# Patient Record
Sex: Female | Born: 2000 | Race: White | Hispanic: No | Marital: Single | Smoking: Former smoker
Health system: Southern US, Community
[De-identification: ages and names within clinical notes are randomized; demographics above are authoritative.]

## PROBLEM LIST (undated history)

## (undated) DIAGNOSIS — T7840XA Allergy, unspecified, initial encounter: Secondary | ICD-10-CM

## (undated) DIAGNOSIS — G43909 Migraine, unspecified, not intractable, without status migrainosus: Secondary | ICD-10-CM

## (undated) DIAGNOSIS — J45909 Unspecified asthma, uncomplicated: Secondary | ICD-10-CM

## (undated) DIAGNOSIS — F419 Anxiety disorder, unspecified: Secondary | ICD-10-CM

## (undated) DIAGNOSIS — A770 Spotted fever due to Rickettsia rickettsii: Secondary | ICD-10-CM

---
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
## 2020-10-09 ENCOUNTER — Other Ambulatory Visit: Payer: Self-pay | Admitting: Internal Medicine

## 2020-10-10 ENCOUNTER — Other Ambulatory Visit: Payer: Self-pay | Admitting: Internal Medicine

## 2020-10-10 DIAGNOSIS — R1011 Right upper quadrant pain: Secondary | ICD-10-CM

## 2020-10-27 ENCOUNTER — Other Ambulatory Visit

## 2020-11-24 ENCOUNTER — Ambulatory Visit
Admission: RE | Admit: 2020-11-24 | Discharge: 2020-11-24 | Disposition: A | Discharge status: Home / Self Care | Source: Ambulatory Visit | Attending: Internal Medicine | Admitting: Internal Medicine

## 2020-11-24 DIAGNOSIS — R1011 Right upper quadrant pain: Secondary | ICD-10-CM

## 2021-01-09 ENCOUNTER — Emergency Department (HOSPITAL_COMMUNITY)

## 2021-01-09 ENCOUNTER — Other Ambulatory Visit: Payer: Self-pay

## 2021-01-09 ENCOUNTER — Encounter (HOSPITAL_COMMUNITY): Payer: Self-pay

## 2021-01-09 ENCOUNTER — Emergency Department (HOSPITAL_COMMUNITY)
Admission: EM | Admit: 2021-01-09 | Discharge: 2021-01-09 | Disposition: A | Discharge status: Home / Self Care | Attending: Emergency Medicine | Admitting: Emergency Medicine

## 2021-01-09 DIAGNOSIS — U071 COVID-19: Secondary | ICD-10-CM | POA: Diagnosis not present

## 2021-01-09 DIAGNOSIS — R42 Dizziness and giddiness: Secondary | ICD-10-CM | POA: Diagnosis not present

## 2021-01-09 DIAGNOSIS — R051 Acute cough: Secondary | ICD-10-CM | POA: Diagnosis not present

## 2021-01-09 DIAGNOSIS — R918 Other nonspecific abnormal finding of lung field: Secondary | ICD-10-CM | POA: Diagnosis not present

## 2021-01-09 DIAGNOSIS — Z87891 Personal history of nicotine dependence: Secondary | ICD-10-CM | POA: Diagnosis not present

## 2021-01-09 DIAGNOSIS — M791 Myalgia, unspecified site: Secondary | ICD-10-CM

## 2021-01-09 DIAGNOSIS — R071 Chest pain on breathing: Secondary | ICD-10-CM | POA: Insufficient documentation

## 2021-01-09 LAB — CBC WITH DIFFERENTIAL/PLATELET
Abs Immature Granulocytes: 0.05 10*3/uL (ref 0.00–0.07)
Basophils Absolute: 0 10*3/uL (ref 0.0–0.1)
Basophils Relative: 0 %
Eosinophils Absolute: 0.1 10*3/uL (ref 0.0–0.5)
Eosinophils Relative: 1 %
HCT: 38.9 % (ref 36.0–46.0)
Hemoglobin: 13.1 g/dL (ref 12.0–15.0)
Immature Granulocytes: 1 %
Lymphocytes Relative: 6 %
Lymphs Abs: 0.4 10*3/uL — ABNORMAL LOW (ref 0.7–4.0)
MCH: 28.9 pg (ref 26.0–34.0)
MCHC: 33.7 g/dL (ref 30.0–36.0)
MCV: 85.7 fL (ref 80.0–100.0)
Monocytes Absolute: 0.9 10*3/uL (ref 0.1–1.0)
Monocytes Relative: 12 %
Neutro Abs: 5.9 10*3/uL (ref 1.7–7.7)
Neutrophils Relative %: 80 %
Platelets: 180 10*3/uL (ref 150–400)
RBC: 4.54 MIL/uL (ref 3.87–5.11)
RDW: 12.2 % (ref 11.5–15.5)
WBC: 7.3 10*3/uL (ref 4.0–10.5)
nRBC: 0 % (ref 0.0–0.2)

## 2021-01-09 LAB — COMPREHENSIVE METABOLIC PANEL
ALT: 22 U/L (ref 0–44)
AST: 20 U/L (ref 15–41)
Albumin: 4 g/dL (ref 3.5–5.0)
Alkaline Phosphatase: 51 U/L (ref 38–126)
Anion gap: 6 (ref 5–15)
BUN: 7 mg/dL (ref 6–20)
CO2: 24 mmol/L (ref 22–32)
Calcium: 9.1 mg/dL (ref 8.9–10.3)
Chloride: 106 mmol/L (ref 98–111)
Creatinine, Ser: 0.63 mg/dL (ref 0.44–1.00)
GFR, Estimated: >60 mL/min (ref >60)
Glucose, Bld: 89 mg/dL (ref 70–99)
Potassium: 3.2 mmol/L — ABNORMAL LOW (ref 3.5–5.1)
Sodium: 136 mmol/L (ref 135–145)
Total Bilirubin: 0.3 mg/dL (ref 0.3–1.2)
Total Protein: 6.8 g/dL (ref 6.5–8.1)

## 2021-01-09 LAB — I-STAT BETA HCG BLOOD, ED (MC, WL, AP ONLY): I-stat hCG, quantitative: <5 m[IU]/mL (ref <5)

## 2021-01-09 LAB — TROPONIN I (HIGH SENSITIVITY): Troponin I (High Sensitivity): <2 ng/L (ref <18)

## 2021-01-09 LAB — D-DIMER, QUANTITATIVE: D-Dimer, Quant: 1.54 ug/mL-FEU — ABNORMAL HIGH (ref 0.00–0.50)

## 2021-01-09 MED ORDER — LORAZEPAM 2 MG/ML IJ SOLN
1.0000 mg | Freq: Once | INTRAMUSCULAR | Status: AC
  Administered 2021-01-09: 1 mg via INTRAVENOUS
  Filled 2021-01-09: qty 1

## 2021-01-09 MED ORDER — ACETAMINOPHEN 500 MG PO TABS
1000.0000 mg | ORAL_TABLET | Freq: Once | ORAL | Status: AC
  Administered 2021-01-09: 1000 mg via ORAL
  Filled 2021-01-09: qty 2

## 2021-01-09 MED ORDER — ONDANSETRON HCL 4 MG/2ML IJ SOLN
4.0000 mg | Freq: Once | INTRAMUSCULAR | Status: AC
  Administered 2021-01-09: 4 mg via INTRAVENOUS
  Filled 2021-01-09: qty 2

## 2021-01-09 MED ORDER — IBUPROFEN 600 MG PO TABS: 600.0000 mg | ORAL_TABLET | Freq: Four times a day (QID) | ORAL | 0 refills | Status: AC | PRN

## 2021-01-09 MED ORDER — KETOROLAC TROMETHAMINE 30 MG/ML IJ SOLN
30.0000 mg | Freq: Once | INTRAMUSCULAR | Status: AC
  Administered 2021-01-09: 30 mg via INTRAVENOUS
  Filled 2021-01-09: qty 1

## 2021-01-09 MED ORDER — ONDANSETRON 4 MG PO TBDP: ORAL_TABLET | ORAL | 0 refills | Status: DC

## 2021-01-09 MED ORDER — SODIUM CHLORIDE 0.9 % IV BOLUS
1000.0000 mL | Freq: Once | INTRAVENOUS | Status: AC
  Administered 2021-01-09: 1000 mL via INTRAVENOUS

## 2021-01-09 MED ORDER — IOHEXOL 350 MG/ML SOLN
50.0000 mL | Freq: Once | INTRAVENOUS | Status: AC | PRN
  Administered 2021-01-09: 50 mL via INTRAVENOUS

## 2021-01-09 NOTE — Discharge Instructions (Addendum)
You have tested positive for COVID-19 virus.  Please continue to quarantine at home and monitor your symptoms closely. You chest x-ray was clear and CT shows no evidence of blood clot or pneumonia, the rest of your lab work has been reassuring. Antibiotics are not helpful in treating viral infection, the virus should run its course in about 10-14 days. Please make sure you are drinking plenty of fluids. You can treat your symptoms supportively with tylenol 1000 mg and ibuprofen 600 mg every 6 hours for fevers and pains, Zofran as needed for nausea, and over the counter cough syrups and throat lozenges to help with cough. If your symptoms are not improving please follow up with you Primary doctor.   I recommend that you purchase a home pulse ox to help better monitor your oxygen at home, if you start to have increased work of breathing or shortness of breath or your oxygen drops below 90% please immediately return to the hospital for reevaluation.  If you develop persistent fevers, shortness of breath or difficulty breathing, chest pain, severe headache and neck pain, persistent nausea and vomiting or other new or concerning symptoms return to the Emergency department.

## 2021-01-09 NOTE — ED Provider Notes (Signed)
[facility] EMERGENCY DEPARTMENT Provider Note   CSN: [id number] Arrival date & time: 01/09/21  1232     History Chief Complaint  Patient presents with  . Covid Positive    Deborah Gould is a 20 y.o. female.  Deborah Gould is a 20 y.o. female with a history of anxiety, migraines, [place] spotted fever, who presents to the ED via EMS from [facility] for evaluation of body aches and chest pain after patient was diagnosed with COVID.  Patient reports symptoms all started this morning, fever of 101, diffuse body aches and chest pain.  Patient reports it feels like something is sitting on her chest and pain is worse when she breathes.  She went to [facility] care clinic where she tested positive for COVID.  Patient reports she is received 2 doses of vaccine but no booster.  Reports she is having diffuse body aches that are severe.  Not improved with Tylenol that she took prior to arrival.  Reports that she is felt a bit lightheaded today but has not had anything to eat or drink.  Has had some nausea but no vomiting.  Denies abdominal pain.  Reports some associated cough and congestion.  Prior history of asthma in childhood but has not had any issues with her asthma in many years.  Also reports anxiety.  Because of patient's tachycardia and complaints of chest pain severe body aches, she was sent for further evaluation of typical COVID symptoms versus PE.        Past Medical History:  Diagnosis Date  . Allergy   . Anxiety    Panic Attacks  . Migraine   . [place] spotted fever     Patient Active Problem List   Diagnosis Date Noted  . Numbness 06/15/2019  . Tremor 06/15/2019  . Vitamin D deficiency 06/15/2019  . Neck pain 06/15/2019  . Seizure-like activity (HCC) 10/17/2017  . Intractable migraine without aura and with status migrainosus 05/15/2017  . Chronic daily headache 05/15/2017  . Anxiety state 05/15/2017  . Insomnia 05/15/2017   . Acute sinusitis 05/15/2017  . Appendicitis 08/01/2016  . Acute appendicitis 08/01/2016    Past Surgical History:  Procedure Laterality Date  . LAPAROSCOPIC APPENDECTOMY N/A 08/01/2016   Procedure: APPENDECTOMY LAPAROSCOPIC;  Surgeon: Leonia Corona, MD;  Location: [facility] OR;  Service: General;  Laterality: N/A;  . Nexplanon insertion  05/14/2019  . RADIOLOGY WITH ANESTHESIA N/A 03/16/2019   Procedure: MRI OF CERVICAL SPINE AND LUMBAR SPINE WITHOUT CONTRAST;  Surgeon: Radiologist, Medication, MD;  Location: [facility] OR;  Service: Radiology;  Laterality: N/A;  . RADIOLOGY WITH ANESTHESIA N/A 07/06/2019   Procedure: MRI WITH ANESTHESIA OF BRAIN WITH AND WIHTOUT CONTRAST;  Surgeon: Radiologist, Medication, MD;  Location: [facility] OR;  Service: Radiology;  Laterality: N/A;     OB History    Gravida  0   Para  0   Term  0   Preterm  0   AB  0   Living  0     SAB  0   IAB  0   Ectopic  0   Multiple  0   Live Births  0           Family History  Adopted: Yes  Problem Relation Age of Onset  . Seizures Mother   . Depression Mother   . Anxiety disorder Mother   . Drug abuse Mother   . Ovarian cysts Mother   . Depression  Father   . Anxiety disorder Father   . Drug abuse Father   . Migraines Neg Hx   . Bipolar disorder Neg Hx   . Schizophrenia Neg Hx   . ADD / ADHD Neg Hx   . Autism Neg Hx     Social History   Tobacco Use  . Smoking status: Former Smoker    Types: Cigarettes    Quit date: 09/16/2016    Years since quitting: 4.3  . Smokeless tobacco: Never Used  Vaping Use  . Vaping Use: Every day  . Substances: Nicotine, CBD, Flavoring  . Devices: Mint 50% [employer]  Substance Use Topics  . Alcohol use: No  . Drug use: Not Currently    Types: Marijuana    Comment: Last Use Monday 03/15/19     Home Medications Prior to Admission medications   Medication Sig Start Date End Date Taking? Authorizing Provider  ibuprofen (ADVIL) 600 MG tablet Take 1 tablet (600 mg  total) by mouth every 6 (six) hours as needed. 01/09/21  Yes Dartha Lodge, PA-C  ondansetron (ZOFRAN ODT) 4 MG disintegrating tablet 4mg  ODT q4 hours prn nausea/vomit 01/09/21  Yes Ryanna Teschner, 03/11/21, PA-C  Cholecalciferol (DIALYVITE VITAMIN D 5000) 125 MCG (5000 UT) capsule Take 5,000 Units by mouth daily.    [provider]  lamoTRIgine (LAMICTAL) 25 MG tablet Take 25 mg by mouth daily.  06/09/19   [provider]  LORazepam (ATIVAN) 0.5 MG tablet Take 0.5 mg by mouth at bedtime.  06/09/19   [provider]  Melatonin 10 MG CAPS Take 10 mg by mouth at bedtime.    [provider]  meloxicam (MOBIC) 15 MG tablet Take 15 mg by mouth daily.  06/09/19   [provider]  propranolol ER (INDERAL LA) 60 MG 24 hr capsule Take 1 capsule (60 mg total) by mouth daily. 06/15/19   Sater, 06/17/19, MD  sertraline (ZOLOFT) 25 MG tablet Take 25 mg by mouth daily. 06/09/19   [provider]    Allergies    Other and Mold extract [trichophyton]  Review of Systems   Review of Systems  Constitutional: Positive for chills and fever.  HENT: Positive for congestion.   Respiratory: Positive for cough and shortness of breath.   Cardiovascular: Positive for chest pain. Negative for palpitations and leg swelling.  Gastrointestinal: Positive for nausea. Negative for abdominal pain and vomiting.  Musculoskeletal: Positive for arthralgias and myalgias.  Skin: Negative for color change and rash.  Neurological: Positive for headaches.  All other systems reviewed and are negative.   Physical Exam Updated Vital Signs BP 109/85 (BP Location: Right Arm)   Pulse (!) 110   Temp 99 F (37.2 C) (Oral)   Resp (!) 22   Ht 5\' 3"  (1.6 m)   Wt 70.3 kg   SpO2 99%   BMI 27.46 kg/m   Physical Exam Vitals and nursing note reviewed.  Constitutional:      General: She is not in acute distress.    Appearance: Normal appearance. She is well-developed. She is not ill-appearing  or diaphoretic.     Comments: Patient is alert, she appears very anxious and uncomfortable  HENT:     Head: Normocephalic and atraumatic.     Nose: Congestion and rhinorrhea present.     Mouth/Throat:     Mouth: Mucous membranes are moist.     Pharynx: Oropharynx is clear. No posterior oropharyngeal erythema.  Eyes:  General:        Right eye: No discharge.        Left eye: No discharge.  Neck:     Comments: No rigidity Cardiovascular:     Rate and Rhythm: Regular rhythm. Tachycardia present.     Heart sounds: Normal heart sounds. No murmur heard. No friction rub. No gallop.   Pulmonary:     Effort: Pulmonary effort is normal. No respiratory distress.     Breath sounds: Normal breath sounds.     Comments: Respirations equal and unlabored, patient able to speak in full sentences, lungs clear to auscultation bilaterally  Abdominal:     General: Bowel sounds are normal. There is no distension.     Palpations: Abdomen is soft. There is no mass.     Tenderness: There is no abdominal tenderness. There is no guarding.     Comments: Abdomen soft, nondistended, nontender to palpation in all quadrants without guarding or peritoneal signs  Musculoskeletal:        General: No deformity.     Cervical back: Neck supple.  Lymphadenopathy:     Cervical: No cervical adenopathy.  Skin:    General: Skin is warm and dry.     Capillary Refill: Capillary refill takes less than 2 seconds.  Neurological:     Mental Status: She is alert and oriented to person, place, and time.     Comments: Speech is clear, able to follow commands Moves extremities without ataxia, coordination intact  Psychiatric:        Mood and Affect: Mood normal.        Behavior: Behavior normal.     ED Results / Procedures / Treatments   Labs (all labs ordered are listed, but only abnormal results are displayed) Labs Reviewed  COMPREHENSIVE METABOLIC PANEL - Abnormal; Notable for the following components:      Result  Value   Potassium 3.2 (*)    All other components within normal limits  CBC WITH DIFFERENTIAL/PLATELET - Abnormal; Notable for the following components:   Lymphs Abs 0.4 (*)    All other components within normal limits  D-DIMER, QUANTITATIVE - Abnormal; Notable for the following components:   D-Dimer, Quant 1.54 (*)    All other components within normal limits  I-STAT BETA HCG BLOOD, ED ([facility], [facility], [facility] ONLY)  TROPONIN I (HIGH SENSITIVITY)  TROPONIN I (HIGH SENSITIVITY)    EKG EKG Interpretation  Date/Time:  Tuesday Jan 09 2021 12:40:48 EDT Ventricular Rate:  97 PR Interval:  148 QRS Duration: 80 QT Interval:  322 QTC Calculation: 408 R Axis:   72 Text Interpretation: Normal sinus rhythm Normal ECG Confirmed by Virgina Norfolk ([id number]) on 01/09/2021 12:45:42 PM   Radiology CT Angio Chest PE W and/or Wo Contrast  Result Date: 01/09/2021 CLINICAL DATA:  Chest pain. Concern for pulmonary embolism. COVID positive. EXAM: CT ANGIOGRAPHY CHEST WITH CONTRAST TECHNIQUE: Multidetector CT imaging of the chest was performed using the standard protocol during bolus administration of intravenous contrast. Multiplanar CT image reconstructions and MIPs were obtained to evaluate the vascular anatomy. CONTRAST:  3mL OMNIPAQUE IOHEXOL 350 MG/ML SOLN COMPARISON:  Chest radiograph 01/10/2019 FINDINGS: Cardiovascular: No filling defects within the pulmonary arteries to suggest acute pulmonary embolism. Mediastinum/Nodes: No axillary or supraclavicular adenopathy. No mediastinal or hilar adenopathy. No pericardial fluid. Esophagus normal. Lungs/Pleura: No pulmonary infarction. No pleural fluid or pneumonia. Upper Abdomen: Limited view of the liver, kidneys, pancreas are unremarkable. Normal adrenal glands. Musculoskeletal: No osseous abnormality Review of  the MIP images confirms the above findings. IMPRESSION: 1. No evidence acute pulmonary embolism. 2. No acute pulmonary parenchymal abnormality. Electronically Signed    By: Genevive Bi M.D.   On: 01/09/2021 17:37   DG Chest Port 1 View  Result Date: 01/09/2021 CLINICAL DATA:  COVID positive EXAM: PORTABLE CHEST 1 VIEW COMPARISON:  Chest radiograph dated 05/02/2014. FINDINGS: The heart size and mediastinal contours are within normal limits. Minimal interstitial and airspace opacities are seen in the left mid and lower lung. The right lung appears clear. There is no pleural effusion or pneumothorax. The visualized skeletal structures are unremarkable. IMPRESSION: Minimal interstitial and airspace opacities in the left mid and lower lung may represent pneumonia. Electronically Signed   By: Romona Curls M.D.   On: 01/09/2021 14:46    Procedures Procedures   Medications Ordered in ED Medications  ondansetron (ZOFRAN) injection 4 mg (4 mg Intravenous Given 01/09/21 1445)  sodium chloride 0.9 % bolus 1,000 mL (0 mLs Intravenous Stopped 01/09/21 1728)  acetaminophen (TYLENOL) tablet 1,000 mg (1,000 mg Oral Given 01/09/21 1443)  ketorolac (TORADOL) 30 MG/ML injection 30 mg (30 mg Intravenous Given 01/09/21 1646)  LORazepam (ATIVAN) injection 1 mg (1 mg Intravenous Given 01/09/21 1646)  iohexol (OMNIPAQUE) 350 MG/ML injection 50 mL (50 mLs Intravenous Contrast Given 01/09/21 1717)    ED Course  I have reviewed the triage vital signs and the nursing notes.  Pertinent labs & imaging results that were available during my care of the patient were reviewed by me and considered in my medical decision making (see chart for details).    MDM Rules/Calculators/A&P                         20 year old female diagnosed with COVID today, day 1 of symptoms, previously vaccinated, no booster.  Found to be tachycardic, did report fever prior to arrival.  Complaining of diffuse body aches as well as pain in her chest that is worse with deep breath.  My highest clinical suspicion is that all the symptoms are due to viral syndrome, patient is also quite anxious which may be  exacerbating things.  But she is tachycardic, PE also considered.  Will check basic labs, troponin, D-dimer, EKG and chest x-ray.  Will give IV fluids, Zofran, Tylenol and Toradol.  I have independently ordered, reviewed and interpreted all labs and imaging:  EKG: Sinus tachycardia  CBC: No leukocytosis, normal hemoglobin CMP: Potassium 3.2, will encourage increase potassium intake in diet, no other electrolyte derangements, normal renal and liver function Troponin: Negative D-dimer: Elevated at 1.54, will proceed with CTA Pregnancy: Negative  Chest x-ray minimal interstitial and airspace opacities in the left mid and lower lung which may represent pneumonia  Patient initially declined CT.  She reports she has extreme claustrophobia with MRI or CT and typically has to be medicated.  After giving a dose of Ativan patient was able to tolerate CT  CTA: No evidence of PE, no other acute pulmonary parenchymal abnormalities, no evidence of pneumonia  On reevaluation patient's symptoms are improved, tachycardia has resolved, O2 sats 99% on room air.  Discussed that symptoms are all likely due to COVID and she can expect to feel poorly over the next several days.  Spent a long time discussing options for symptomatic control with patient and will have her continue to use these medications at home.  Stressed the importance of hydration.  Discussed with Dr. Silverio Lay, patient does not meet criteria  for oral antiviral therapies.  Will discharge home.  Return precautions provided.   Final Clinical Impression(s) / ED Diagnoses Final diagnoses:  COVID-19 virus infection  Myalgia    Rx / DC Orders ED Discharge Orders         Ordered    ondansetron (ZOFRAN ODT) 4 MG disintegrating tablet        01/09/21 1838    ibuprofen (ADVIL) 600 MG tablet  Every 6 hours PRN        01/09/21 1838           Dartha LodgeFord, Contrina Orona N, PA-C 01/10/21 0224    Virgina Norfolkuratolo, Adam, DO 01/10/21 [id number]

## 2021-01-09 NOTE — ED Triage Notes (Addendum)
Pt BIB [facility] from [facility] c/o body aches after being diagnosed with COVID today. Pt states the reason she came to the ED after testing positive is because of generalized body aches and feeling woozy when she gets up and starts moving around.

## 2021-02-22 ENCOUNTER — Ambulatory Visit (INDEPENDENT_AMBULATORY_CARE_PROVIDER_SITE_OTHER): Admitting: Nurse Practitioner

## 2021-02-22 ENCOUNTER — Other Ambulatory Visit: Payer: Self-pay

## 2021-02-22 VITALS — BP 110/64 | HR 71 | Ht 64.0 in | Wt 170.0 lb

## 2021-02-22 DIAGNOSIS — Z872 Personal history of diseases of the skin and subcutaneous tissue: Secondary | ICD-10-CM | POA: Diagnosis not present

## 2021-02-22 DIAGNOSIS — N644 Mastodynia: Secondary | ICD-10-CM

## 2021-02-22 NOTE — Progress Notes (Signed)
   Acute Office Visit  Subjective:    Patient ID: Deborah Gould, female    DOB: 2000/10/14, 20 y.o.   MRN: [id number]   HPI 20 y.o. presents today for left breast pain. Pain started 5 weeks ago and is described as a constant dull/ache with intermittent sharp/stabbing pain. The sharp pains are severe and take her breath away. Pain is worse with activity and has progressively worsened to the point of nterfering with her work as she has a very active job in Counsellor. Denies redness, lump, or nipple discharge. She does feel it is swollen. History of right breast cyst. She denies pain with inspiration and does not feel it is related to her breathing.    Review of Systems  Constitutional: Negative.   Right breast: Negative Left breast: Positive for pain and swelling. Negative for mass, nipple discharge, or redness    Objective:    Physical Exam Constitutional:      Appearance: Normal appearance.  Chest:  Breasts:    Right: Normal.     Left: Tenderness present. No swelling, inverted nipple, mass, nipple discharge or skin change.      BP 110/64   Pulse 71   Ht 5\' 4"  (1.626 m)   Wt 170 lb (77.1 kg)   SpO2 98%   BMI 29.18 kg/m  Wt Readings from Last 3 Encounters:  02/22/21 170 lb (77.1 kg) (92 %, Z= 1.39)*  01/09/21 155 lb (70.3 kg) (85 %, Z= 1.02)*  07/06/19 150 lb 6.4 oz (68.2 kg) (85 %, Z= 1.02)*   * Growth percentiles are based on CDC (Girls, 2-20 Years) data.        Assessment & Plan:   Problem List Items Addressed This Visit   None Visit Diagnoses     Pain of left breast    -  Primary   History of cyst of breast          Plan: Due to prolonged, progressive pain and history of breast cyst we will send referral for left breast ultrasound. Recommend supportive bra that is not too tight and tylenol/ibuprofen as needed for pain. She is agreeable to plan.      13/03/20 DNP, 3:12 PM 02/22/2021

## 2021-02-26 ENCOUNTER — Telehealth: Payer: Self-pay | Admitting: *Deleted

## 2021-02-26 DIAGNOSIS — N644 Mastodynia: Secondary | ICD-10-CM

## 2021-02-26 NOTE — Telephone Encounter (Signed)
-----   Message from Olivia Mackie, NP sent at 02/22/2021  3:06 PM EDT ----- Please send referral for left breast ultrasound for pain.

## 2021-02-26 NOTE — Telephone Encounter (Signed)
Patient scheduled at the breast center on 02/28/21 @ 12:30. Patient voicemail is full.

## 2021-02-27 NOTE — Telephone Encounter (Signed)
Patient informed with below note. 

## 2021-02-28 ENCOUNTER — Other Ambulatory Visit: Payer: Self-pay

## 2021-02-28 ENCOUNTER — Ambulatory Visit
Admission: RE | Admit: 2021-02-28 | Discharge: 2021-02-28 | Disposition: A | Discharge status: Home / Self Care | Source: Ambulatory Visit | Attending: Nurse Practitioner | Admitting: Nurse Practitioner

## 2021-02-28 DIAGNOSIS — N644 Mastodynia: Secondary | ICD-10-CM

## 2021-03-02 ENCOUNTER — Telehealth: Payer: Self-pay | Admitting: *Deleted

## 2021-03-02 DIAGNOSIS — N6452 Nipple discharge: Secondary | ICD-10-CM

## 2021-03-02 NOTE — Telephone Encounter (Signed)
Patient had left breast ultrasound on 02/28/21. Patient said this am about 1 hour after getting dressed she noticed a damp spot in her bra of left breast which was clear discharge. This is new for her, she has not noticed any more clear discharge. Reports she still has pain in the left breast. 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 She is taking ibuprofen which helps manage pain for now. But will need to go back to work and would like to have the pain gone by then to be able to fly. Patient asked if you have any recommendations? Please advise

## 2021-03-06 NOTE — Telephone Encounter (Signed)
Reassure her that the ultrasound did not show anything and that the pain is likely due to fibrocystic breast tissue. This is a common condition that can cause painful, lumpy breasts. I do recommend she come in to have prolactin checked due to nipple discharge. Hormonal contraception is an option for management of breast pain when it is likely hormone related. This can be with combination birth control or progestin-only methods, so she has the option for all that are available. Other management options are supportive bras and NSAIDS.

## 2021-03-06 NOTE — Telephone Encounter (Signed)
Left message for patient to call.

## 2021-03-07 NOTE — Telephone Encounter (Signed)
Patient informed coming for labs on 03/09/21 @ 2:00pm

## 2021-03-09 ENCOUNTER — Other Ambulatory Visit

## 2021-03-09 ENCOUNTER — Other Ambulatory Visit: Payer: Self-pay

## 2021-03-09 DIAGNOSIS — N6452 Nipple discharge: Secondary | ICD-10-CM

## 2021-03-10 LAB — PROLACTIN: Prolactin: 13.1 ng/mL

## 2021-03-15 ENCOUNTER — Telehealth: Payer: Self-pay

## 2021-03-15 NOTE — Telephone Encounter (Signed)
Patient called inquiring about lab results. She had not seen [facility]'s My Chart message.  I read it to her. "Hi Deborah Gould, Your prolactin is normal. As mentioned in voicemail, we could try hormonal contraception for management since it is likely related to fibrocystic breast disease and sometimes these can help. Reach out if this is something you wouldlike to try. Have a great day!"  She said she would be interested in talking with TW regarding what options there is. She said she really likes her Nexplanon.

## 2021-03-15 NOTE — Telephone Encounter (Signed)
She can continue with Nexplanon and add a low dose combination birth control pill. If no improvement after a few months she can stop use.

## 2021-03-16 NOTE — Telephone Encounter (Signed)
Left message for patient to call.

## 2021-03-16 NOTE — Telephone Encounter (Signed)
Patient said she would like to add/try the low dose birth control to see if this helps.  Please advise.

## 2021-03-18 ENCOUNTER — Other Ambulatory Visit: Payer: Self-pay | Admitting: Nurse Practitioner

## 2021-03-18 DIAGNOSIS — N644 Mastodynia: Secondary | ICD-10-CM

## 2021-03-18 DIAGNOSIS — Z30011 Encounter for initial prescription of contraceptive pills: Secondary | ICD-10-CM

## 2021-03-18 MED ORDER — NORETHIN ACE-ETH ESTRAD-FE 1-20 MG-MCG PO TABS
1.0000 | ORAL_TABLET | Freq: Every day | ORAL | 2 refills | Status: DC
Start: 2021-03-18 — End: 2021-03-20

## 2021-03-18 NOTE — Telephone Encounter (Signed)
I have sent in the low dose birth control pill. I do want her to be aware that taking a combination birth control pill (contains estrogen) can effect the efficacy of Lamictal. It looks like she was last seen in 2019 for seizure-like activity by neurology? So I am not sure if she is still taking this. But this is something we need to keep an eye on as it can increase her risk for seizures if she is still taking. If she is uncomfortable with taking OCPs that is OK but does limit [place] to managing her breast pain.

## 2021-03-19 NOTE — Telephone Encounter (Signed)
Her pain may be coming from somewhere outside of the breast, such as the chest wall. Symptoms are persistent and worse with activity indicating it may be musculoskeletal in nature. Costochondritis is pain that occurs at the area between the ribs and breast bone, pain can be sharp, or constant and dull/gnawing. It is usually caused by a blow to the chest or heavy lifting. There is no test for this but is diagnosed by symptoms. Unfortunately, this is something that improves over time. Some treatments to help with symptoms include a topical gel or patch, muscle relaxer, heating pad, tylenol/Ibuprofen, and rest. If she is interested in trying a Voltaren gel or a muscle relaxer we can try this for a short period. She can also see her PCP for management.

## 2021-03-19 NOTE — Telephone Encounter (Signed)
Patient informed with below note, she was taking the Lamictal for sleep and she is no longer taking the Rx. She reports the breast pain/ nipple discharge is still on going and is not able to work due to pain. She asked me how long should she see any improvement with BCP, I explained 3 packs sent to pharmacy. Patient reports she can't be out of work for 3 months and if you have any other recommendations for the pain would be get. She is using OTC to help with this.

## 2021-03-20 ENCOUNTER — Other Ambulatory Visit: Payer: Self-pay | Admitting: Nurse Practitioner

## 2021-03-20 DIAGNOSIS — M940 Chondrocostal junction syndrome [Tietze]: Secondary | ICD-10-CM

## 2021-03-20 MED ORDER — DICLOFENAC SODIUM 1 % EX GEL: 2.0000 g | Freq: Four times a day (QID) | CUTANEOUS | 0 refills | Status: DC

## 2021-03-20 NOTE — Telephone Encounter (Signed)
Left message for patient to call.

## 2021-03-20 NOTE — Telephone Encounter (Signed)
Left detailed message on cell per DPR access. 

## 2021-03-20 NOTE — Telephone Encounter (Signed)
Patient informed she would like try the Voltaren gel.

## 2021-03-20 NOTE — Telephone Encounter (Signed)
Voltaren gel sent to pharmacy. She can apply 2 grams up to 4 times per day for pain. I have discontinued the birth control pill at this time as I do not believe it is appropriate.

## 2021-04-09 ENCOUNTER — Ambulatory Visit (INDEPENDENT_AMBULATORY_CARE_PROVIDER_SITE_OTHER): Admitting: Nurse Practitioner

## 2021-04-09 ENCOUNTER — Other Ambulatory Visit: Payer: Self-pay

## 2021-04-09 ENCOUNTER — Encounter: Payer: Self-pay | Admitting: Nurse Practitioner

## 2021-04-09 VITALS — BP 118/76 | Ht 64.0 in | Wt 165.0 lb

## 2021-04-09 DIAGNOSIS — Z0000 Encounter for general adult medical examination without abnormal findings: Secondary | ICD-10-CM

## 2021-04-09 DIAGNOSIS — N644 Mastodynia: Secondary | ICD-10-CM | POA: Diagnosis not present

## 2021-04-09 DIAGNOSIS — N62 Hypertrophy of breast: Secondary | ICD-10-CM

## 2021-04-09 DIAGNOSIS — Z3046 Encounter for surveillance of implantable subdermal contraceptive: Secondary | ICD-10-CM

## 2021-04-09 MED ORDER — NAPROXEN 500 MG PO TABS: 500.0000 mg | ORAL_TABLET | Freq: Two times a day (BID) | ORAL | 1 refills | Status: DC

## 2021-04-09 NOTE — Progress Notes (Signed)
   Deborah Gould 06-Dec-2000 [id number]   History:  20 y.o. G0 presents for annual exam. Cycles occur about every 2 months. Nexplanon inserted 05/2019. Sexually active with long-term partner, declines STD screening. She is still experiencing left breast pain. She had negative ultrasound 02/28/2021. She has been taking Ibuprofen with little relief and using Voltaren gel at night. 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 She wears size E bras and has experienced neck and back pain since around age 52.  Gynecologic History Patient's last menstrual period was 04/09/2021. Period Cycle (Days): 60 Period Duration (Days): 7 Period Pattern: Regular Menstrual Flow: Heavy Dysmenorrhea: (!) Moderate Dysmenorrhea Symptoms: Cramping Contraception/Family planning: Nexplanon Sexually active: Yes  Health Maintenance Last Pap: Not indicated Last mammogram: Not indicated Last colonoscopy: Not indicated Last Dexa: Not indicated  Past medical history, past surgical history, family history and social history were all reviewed and documented in the EPIC chart. Works in Counsellor. MGM and MGGM deceased from breast cancer. She does not know much about biological mother's health.   ROS:  A ROS was performed and pertinent positives and negatives are included.  Exam:  Vitals:   04/09/21 1416  BP: 118/76  Weight: 165 lb (74.8 kg)  Height: 5\' 4"  (1.626 m)   Body mass index is 28.32 kg/m.  General appearance:  Normal Thyroid:  Symmetrical, normal in size, without palpable masses or nodularity. Respiratory  Auscultation:  Clear without wheezing or rhonchi Cardiovascular  Auscultation:  Regular rate, without rubs, murmurs or gallops  Edema/varicosities:  Not grossly evident Abdominal  Soft,nontender, without masses, guarding or rebound.  Liver/spleen:  No organomegaly noted  Hernia:  None appreciated  Skin  Inspection:  Grossly normal Breasts: Not indicated. Same symptoms  since last visit. Genitourinary : Deferred  Assessment/Plan:  20 y.o. G0 for annual exam.   Well female exam without gynecological exam - Education provided on SBEs, importance of preventative screenings, current guidelines, high calcium diet, regular exercise, and multivitamin daily.   Encounter for surveillance of implantable subdermal contraceptive - Nexplanon inserted 05/2019. She is aware it is good for 3 years. Having cycles about every 2 months.    Breast hypertrophy - She wears size E bras and has experienced neck and back pain since around age 71. She has had severe left breast pain for over 2 months with minimal relief with medication, supportive bras, and rest. She is interested in a breast reduction.   Breast pain, left - this is not new and has been evaluated with ultrasound. Pain likely from hypertrophy and management may be a breast reduction as she has had no improvement with other supportive measures. Recommend continuing Voltaren gel as needed and will add naproxen 500 mg twice daily. If no improvement we will send referral to plastic surgery for breast reduction evaluation. She is agreeable to plan.   Return in 1 year for annual.   18 DNP, 2:41 PM 04/09/2021

## 2021-04-10 ENCOUNTER — Encounter: Payer: Self-pay | Admitting: Nurse Practitioner

## 2021-04-12 ENCOUNTER — Telehealth: Payer: Self-pay | Admitting: *Deleted

## 2021-04-12 DIAGNOSIS — N62 Hypertrophy of breast: Secondary | ICD-10-CM

## 2021-04-12 NOTE — Telephone Encounter (Signed)
-----   Message from Deborah Mackie, NP sent at 04/12/2021  9:45 AM EDT ----- Please send referral to plastic surgery to discuss breast reduction surgery for breast hypertrophy that has caused chronic neck and back pain as well as recent severe left breast pain. Thank you.

## 2021-04-12 NOTE — Telephone Encounter (Signed)
Referral placed at [facility] they will call to schedule.

## 2021-04-18 NOTE — Telephone Encounter (Signed)
Patient scheduled on 07/14/21 with Dr.Pace.

## 2021-07-11 ENCOUNTER — Ambulatory Visit (INDEPENDENT_AMBULATORY_CARE_PROVIDER_SITE_OTHER): Admitting: Plastic Surgery

## 2021-07-11 ENCOUNTER — Other Ambulatory Visit: Payer: Self-pay

## 2021-07-11 VITALS — BP 120/79 | HR 86 | Ht 63.0 in | Wt 168.8 lb

## 2021-07-11 DIAGNOSIS — N62 Hypertrophy of breast: Secondary | ICD-10-CM | POA: Diagnosis not present

## 2021-07-11 DIAGNOSIS — M4004 Postural kyphosis, thoracic region: Secondary | ICD-10-CM | POA: Diagnosis not present

## 2021-07-11 DIAGNOSIS — M546 Pain in thoracic spine: Secondary | ICD-10-CM | POA: Diagnosis not present

## 2021-07-11 DIAGNOSIS — M545 Low back pain, unspecified: Secondary | ICD-10-CM

## 2021-07-11 NOTE — Progress Notes (Signed)
Referring Provider Charlane Ferretti, DO [address] [place],  [place] [address]   CC:  Chief Complaint  Patient presents with   Advice Only      Deborah Gould is an 20 y.o. female.  HPI: Patient presents to discuss breast reduction.  She has had years of back pain, neck pain and shoulder grooving related to her large breast.  She is tried over-the-counter medications, warm packs, cold packs and supportive bras with little relief.  She also gets rashes beneath her breast that been refractory to over-the-counter treatments.  She reports that she had a left breast ultrasound in the past due to nipple discharge which was negative.  She does have a family history of breast cancer on both her mom and dad sides of the family.  She is not a diabetic.  She reports infrequent smoking.  She does not take a blood thinner.  She has been to physical therapy in the past for her back.  Allergies  Allergen Reactions   Other     Mother reports patient is allergic to all pollen, german cockroach, tobacco, mold, and dust.   Mold Extract [Trichophyton] Cough    Outpatient Encounter Medications as of 07/11/2021  Medication Sig   Cholecalciferol (DIALYVITE VITAMIN D 5000) 125 MCG (5000 UT) capsule Take 5,000 Units by mouth daily.   ibuprofen (ADVIL) 600 MG tablet Take 1 tablet (600 mg total) by mouth every 6 (six) hours as needed.   Melatonin 10 MG CAPS Take 10 mg by mouth at bedtime.   diclofenac Sodium (VOLTAREN) 1 % GEL Apply 2 g topically 4 (four) times daily. (Patient not taking: Reported on 07/11/2021)   naproxen (NAPROSYN) 500 MG tablet Take 1 tablet (500 mg total) by mouth 2 (two) times daily with a meal. (Patient not taking: Reported on 07/11/2021)   No facility-administered encounter medications on file as of 07/11/2021.     Past Medical History:  Diagnosis Date   Allergy    Anxiety    Panic Attacks   Migraine    Rocky Mountain spotted fever     Past Surgical History:  Procedure Laterality  Date   LAPAROSCOPIC APPENDECTOMY N/A 08/01/2016   Procedure: APPENDECTOMY LAPAROSCOPIC;  Surgeon: Leonia Corona, MD;  Location: [facility] OR;  Service: General;  Laterality: N/A;   Nexplanon insertion  05/14/2019   RADIOLOGY WITH ANESTHESIA N/A 03/16/2019   Procedure: MRI OF CERVICAL SPINE AND LUMBAR SPINE WITHOUT CONTRAST;  Surgeon: Radiologist, Medication, MD;  Location: [facility] OR;  Service: Radiology;  Laterality: N/A;   RADIOLOGY WITH ANESTHESIA N/A 07/06/2019   Procedure: MRI WITH ANESTHESIA OF BRAIN WITH AND WIHTOUT CONTRAST;  Surgeon: Radiologist, Medication, MD;  Location: [facility];  Service: Radiology;  Laterality: N/A;    Family History  Adopted: Yes  Problem Relation Age of Onset   Seizures Mother    Depression Mother    Anxiety disorder Mother    Drug abuse Mother    Ovarian cysts Mother    Depression Father    Anxiety disorder Father    Drug abuse Father    Migraines Neg Hx    Bipolar disorder Neg Hx    Schizophrenia Neg Hx    ADD / ADHD Neg Hx    Autism Neg Hx     Social History   Social History Narrative   Deborah Gould is in the 10th grade at [employer] and [name]; she does very well in school. She lives with her adoptive parents who are her  maternal grandparents. She is part of the [employer].   Right handed    Coffee: 1 cup every morning   Lives with adoptive mother and aunt     Review of Systems General: Denies fevers, chills, weight loss CV: Denies chest pain, shortness of breath, palpitations  Physical Exam Vitals with BMI 07/11/2021 04/09/2021 02/22/2021  Height 5\' 3"  5\' 4"  5\' 4"   Weight 168 lbs 13 oz 165 lbs 170 lbs  BMI 29.91 28.31 29.17  Systolic 120 118  Diastolic 79 76 64  Pulse 86 - 71  Some encounter information is confidential and restricted. Go to Review Flowsheets activity to see all data.    General:  No acute distress,  Alert and oriented, Non-Toxic, Normal speech and affect Breast: She has grade 2 ptosis.  Sternal notch to nipple is 30 cm  bilaterally.  Nipple to fold is 14 cm bilaterally  Assessment/Plan The patient has bilateral symptomatic macromastia.  She is a good candidate for a breast reduction.  She is interested in pursuing surgical treatment.  She has tried supportive garments and fitted bras with no relief.  The details of breast reduction surgery were discussed.  I explained the procedure in detail along the with the expected scars.  The risks were discussed in detail and include bleeding, infection, damage to surrounding structures, need for additional procedures, nipple loss, change in nipple sensation, persistent pain, contour irregularities and asymmetries.  I explained that breast feeding is often not possible after breast reduction surgery.  We discussed the expected postoperative course with an overall recovery period of about 1 month.  She demonstrated full understanding of all risks.  We discussed her personal risk factors.  The patient is interested in pursuing surgical treatment.  I anticipate approximately 485g of tissue removed from each side.   07/11/2021, 5:31 PM

## 2021-09-06 ENCOUNTER — Other Ambulatory Visit: Payer: Self-pay

## 2021-09-06 ENCOUNTER — Ambulatory Visit: Attending: Plastic Surgery

## 2021-09-06 DIAGNOSIS — M546 Pain in thoracic spine: Secondary | ICD-10-CM | POA: Diagnosis not present

## 2021-09-06 DIAGNOSIS — R29898 Other symptoms and signs involving the musculoskeletal system: Secondary | ICD-10-CM | POA: Diagnosis present

## 2021-09-06 DIAGNOSIS — N62 Hypertrophy of breast: Secondary | ICD-10-CM | POA: Diagnosis present

## 2021-09-06 NOTE — Therapy (Signed)
OUTPATIENT PHYSICAL THERAPY CERVICAL EVALUATION   Patient Name: Deborah Gould MRN: [id number] DOB:Sep 10, 2000, 21 y.o., female Today's Date: 09/07/2021   PT End of Session - 09/06/21 1556     Visit Number 1    Number of Visits 6    Date for PT Re-Evaluation 11/02/21    Authorization Type CHAMPVA    PT Start Time 1547    PT Stop Time 1635    PT Time Calculation (min) 48 min    Activity Tolerance Patient limited by pain    Behavior During Therapy [facility] for tasks assessed/performed             Past Medical History:  Diagnosis Date   Allergy    Anxiety    Panic Attacks   Migraine    Rocky Mountain spotted fever    Past Surgical History:  Procedure Laterality Date   LAPAROSCOPIC APPENDECTOMY N/A 08/01/2016   Procedure: APPENDECTOMY LAPAROSCOPIC;  Surgeon: Leonia Corona, MD;  [facility];  Service: General;  Laterality: N/A;   Nexplanon insertion  05/14/2019   RADIOLOGY WITH ANESTHESIA N/A 03/16/2019   Procedure: MRI OF CERVICAL SPINE AND LUMBAR SPINE WITHOUT CONTRAST;  Surgeon: Radiologist, Medication, MD;  [facility];  Service: Radiology;  Laterality: N/A;   RADIOLOGY WITH ANESTHESIA N/A 07/06/2019   Procedure: MRI WITH ANESTHESIA OF BRAIN WITH AND WIHTOUT CONTRAST;  Surgeon: Radiologist, Medication, MD;  Location: [facility];  Service: Radiology;  Laterality: N/A;   Patient Active Problem List   Diagnosis Date Noted   Numbness 06/15/2019   Tremor 06/15/2019   Vitamin D deficiency 06/15/2019   Neck pain 06/15/2019   Seizure-like activity (HCC) 10/17/2017   Intractable migraine without aura and with status migrainosus 05/15/2017   Chronic daily headache 05/15/2017   Anxiety state 05/15/2017   Insomnia 05/15/2017   Acute sinusitis 05/15/2017   Appendicitis 08/01/2016   Acute appendicitis 08/01/2016    PCP: Charlane Ferretti, DO  REFERRING PROVIDER: Allena Napoleon, MD  REFERRING DIAG: Hypertrophy of breast, Low back pain, unspecified Pain in thoracic spine,  Postural kyphosis thoracic region   THERAPY DIAG:  Pain in thoracic spine  Hypertrophy of breast  Decreased ROM of neck  ONSET DATE: 7 years  SUBJECTIVE:                                                                                                                                                                                                         SUBJECTIVE STATEMENT: Pt reports heaviness of her head and her lowwer neck and mid back hurt  constantly  PERTINENT HISTORY:  Migraines, anxiety  PAIN:  Are you having pain? Yes VAS scale: 4/10, range of 0-9/10 Pain location: neck, mid back Pain orientation: Bilateral  PAIN TYPE: aching and burning Pain description: intermittent , in pain more than not in than Aggravating factors: Prolonged sitting, sleeping in certain positions; painful when waking up, airplane turbulance Relieving factors: Heat, ibuprofen  PRECAUTIONS: None  WEIGHT BEARING RESTRICTIONS No  FALLS:  Has patient fallen in last 6 months? No Number of falls: 0  LIVING ENVIRONMENT: no issues Lives with: lives with their family Pt reports n issues with accessing or mobility in her home  OCCUPATION: Occupational hygienistilot and maintains airplanes  PLOF: Independent  PATIENT GOALS : Pain relief and better mobility of my neck and shoulders  OBJECTIVE:  03/16/19: DIAGNOSTIC FINDINGS:  IMPRESSION: No impingement or other finding to explain left hand symptoms.   Tiny disc protrusions at C5-6 and C6-7 without cord contact.  IMPRESSION: Normal lumbar MRI.   COGNITION: Overall cognitive status: Within functional limits for tasks assessed   SENSATION: Light touch: Appears intact; occasional tingling in L hand for short periods  POSTURE:  Forward head, rounded shoulders, increased thoracic kyphosis  PALPATION: TTP to the suboccipital area, upper traps, levator scapula, and interscapula muscles  CERVICAL AROM/PROM  A/PROM A/PROM (deg) 09/07/2021  Flexion 50   Extension 35  Right lateral flexion 30  Left lateral flexion 30  Right rotation 52  Left rotation 50   (Blank rows = not tested) Pt reports pain and tightness of her neck, upper shouldr and mid back with all cervical movements  UE AROM/PROM:  Bilat Ues are WFLs for AROM   UE MMT: Bilat UE demonstrate 4+ to 5/5 strength c MMT  CERVICAL SPECIAL TESTS:  Spurling's test: Negative  TODAY'S TREATMENT:  - Cervical retractions, x5, 3 sec -Scapular retractions, x5, 3 sec -Upper trap strech, x2, 20 sec -Levator stretch, 2x, 20 sec   PATIENT EDUCATION:  Education details: Eval findings, POC, HEP, sleeping positions and support for comfort, use of theracare for management of muscle tightness and pain Person educated: Patient Education method: Explanation, Demonstration, Tactile cues, Verbal cues, and Handouts Education comprehension: verbalized understanding, returned demonstration, verbal cues required, and tactile cues required   HOME EXERCISE PROGRAM: Access Code: [email or web address] URL: [email or web address] Date: 09/07/2021 Prepared by: Joellyn RuedAllen Chaun Uemura  Exercises Seated Scapular Retraction - 6 x daily - 7 x weekly - 1 sets - 5 reps - 3 hold Seated Passive Cervical Retraction - 6 x daily - 7 x weekly - 1 sets - 5 reps - 3 hold Seated Upper Trapezius Stretch - 3 x daily - 7 x weekly - 1 sets - 3 reps - 20 hold Gentle Levator Scapulae Stretch - 3 x daily - 7 x weekly - 1 sets - 3 reps - 20 hold   ASSESSMENT:  CLINICAL IMPRESSION: Patient is a 21 y.o. F who was seen today for physical therapy evaluation and treatment for Hypertrophy of breast, Low back pain, unspecified Pain in thoracic spine, Postural kyphosis thoracic region . Objective impairments include decreased ROM, decreased strength, postural dysfunction, and pain. These impairments are limiting patient from cleaning, driving, occupation, and shopping. Personal factors including Time since onset of  injury/illness/exacerbation and migraines and anxiety  are also affecting patient's functional outcome. Patient will benefit from skilled PT to address above impairments and improve overall function.  REHAB POTENTIAL: Good  CLINICAL DECISION MAKING: Stable/uncomplicated  EVALUATION COMPLEXITY: Low   GOALS:   SHORT  TERM GOALS= LTGs   LONG TERM GOALS:   LTG Name Target Date Goal status  1 Pt will be Ind in a final HEP to maintain achieved LOF Baseline: 10/26/21 INITIAL  2 Improved pt's cervical ROM for all motions by 10d  for improved cervical function Baseline: see flow sheets 10/26/21 INITIAL  3 Pt will voice understanding of measures to decrease and manage pain Baseline: 10/26/21 INITIAL  4 Pt will report a decrease in pain with daily activities to a range of 5/10 or less for improved neck function and quality of life Baseline: 10/26/21 INITIAL   PLAN: PT FREQUENCY: 1x/week  PT DURATION: 6 weeks  PLANNED INTERVENTIONS: Therapeutic exercises, Therapeutic activity, Patient/Family education, Joint mobilization, Dry Needling, Electrical stimulation, Cryotherapy, Moist heat, Taping, Traction, Ultrasound, Ionotophoresis 4mg /ml Dexamethasone, and Manual therapy  PLAN FOR NEXT SESSION: Asses response to HEP, progress therex as indicated, use of manual therapy and modalitiies as indicated  MS, PT 09/07/21 6:26 PM

## 2021-09-12 ENCOUNTER — Ambulatory Visit

## 2021-09-12 ENCOUNTER — Other Ambulatory Visit: Payer: Self-pay

## 2021-09-12 DIAGNOSIS — R29898 Other symptoms and signs involving the musculoskeletal system: Secondary | ICD-10-CM

## 2021-09-12 DIAGNOSIS — N62 Hypertrophy of breast: Secondary | ICD-10-CM

## 2021-09-12 DIAGNOSIS — M546 Pain in thoracic spine: Secondary | ICD-10-CM | POA: Diagnosis not present

## 2021-09-12 NOTE — Therapy (Signed)
OUTPATIENT PHYSICAL THERAPY TREATMENT NOTE   Patient Name: Deborah Gould MRN: [id number] DOB:2001/06/12, 21 y.o., female Today's Date: 09/12/2021  PCP: Charlane Ferretti, DO REFERRING PROVIDER: Allena Napoleon, MD   PT End of Session - 09/12/21 0802     Visit Number 2    Number of Visits 6    Authorization Type CHAMPVA    PT Start Time 0800    PT Stop Time 0842    PT Time Calculation (min) 42 min    Activity Tolerance Patient limited by pain    Behavior During Therapy [facility] for tasks assessed/performed             Past Medical History:  Diagnosis Date   Allergy    Anxiety    Panic Attacks   Migraine    Rocky Mountain spotted fever    Past Surgical History:  Procedure Laterality Date   LAPAROSCOPIC APPENDECTOMY N/A 08/01/2016   Procedure: APPENDECTOMY LAPAROSCOPIC;  Surgeon: Leonia Corona, MD;  Location: [facility] OR;  Service: General;  Laterality: N/A;   Nexplanon insertion  05/14/2019   RADIOLOGY WITH ANESTHESIA N/A 03/16/2019   Procedure: MRI OF CERVICAL SPINE AND LUMBAR SPINE WITHOUT CONTRAST;  Surgeon: Radiologist, Medication, MD;  Location: [facility];  Service: Radiology;  Laterality: N/A;   RADIOLOGY WITH ANESTHESIA N/A 07/06/2019   Procedure: MRI WITH ANESTHESIA OF BRAIN WITH AND WIHTOUT CONTRAST;  Surgeon: Radiologist, Medication, MD;  Location: [facility];  Service: Radiology;  Laterality: N/A;   Patient Active Problem List   Diagnosis Date Noted   Numbness 06/15/2019   Tremor 06/15/2019   Vitamin D deficiency 06/15/2019   Neck pain 06/15/2019   Seizure-like activity (HCC) 10/17/2017   Intractable migraine without aura and with status migrainosus 05/15/2017   Chronic daily headache 05/15/2017   Anxiety state 05/15/2017   Insomnia 05/15/2017   Acute sinusitis 05/15/2017   Appendicitis 08/01/2016   Acute appendicitis 08/01/2016    REFERRING DIAG: Hypertrophy of breast  THERAPY DIAG:  Pain in thoracic spine  Hypertrophy of breast  Decreased ROM of  neck  PERTINENT HISTORY:   PRECAUTIONS: None  SUBJECTIVE: My neck overall is a little looser. I had an incident yesterday at work where I was not able to turn my head to the L, 7/10 pain level.  PAIN:  Are you having pain? Yes NPRS scale: 3/10 Pain location: neck and upper shoulder Pain orientation: Bilateral  PAIN TYPE: aching, burning, sharp, and tight Pain description: constant  Aggravating factors: Prolonged sitting, sleeping in certain positions; painful when waking up, airplane turbulance Relieving factors: Heat, ibuprofen  OBJECTIVE:  03/16/19: DIAGNOSTIC FINDINGS:  IMPRESSION: No impingement or other finding to explain left hand symptoms.   Tiny disc protrusions at C5-6 and C6-7 without cord contact.   IMPRESSION: Normal lumbar MRI.     COGNITION: Overall cognitive status: Within functional limits for tasks assessed            SENSATION: Light touch: Appears intact; occasional tingling in L hand for short periods   POSTURE:  Forward head, rounded shoulders, increased thoracic kyphosis   PALPATION: TTP to the suboccipital area, upper traps, levator scapula, and interscapula muscles   CERVICAL AROM/PROM   A/PROM A/PROM (deg) 09/07/2021  Flexion 50  Extension 35  Right lateral flexion 30  Left lateral flexion 30  Right rotation 52  Left rotation 50   (Blank rows = not tested) Pt reports pain and tightness of her neck, upper shouldr and mid back with all cervical  movements   UE AROM/PROM:   Bilat Ues are WFLs for AROM    UE MMT: Bilat UE demonstrate 4+ to 5/5 strength c MMT   CERVICAL SPECIAL TESTS:  Spurling's test: Negative  [facility] Adult PT Treatment:                                                DATE: 09/12/21 Therapeutic Exercise: Scapular retraction, x5, 3" Cervical retraction, x5, 3" Upper trap stretch,x2, 20" Levator stretch, x2, 20" Scalene stretch, x2, 20" Doorway pectoral stretch, x2, 20" Bilat ER 2x10, GTB Shoulder row 2x10,  GTB Shoulder ext 2x10, GTB  Self Care: HEP updated. Sitting posture, tennis ball for massage     TODAY'S TREATMENT: Eval - Cervical retractions, x5, 3 sec -Scapular retractions, x5, 3 sec -Upper trap strech, x2, 20 sec -Levator stretch, 2x, 20 sec     PATIENT EDUCATION:  Education details: Eval findings, POC, HEP, sleeping positions and support for comfort, use of theracare for management of muscle tightness and pain Person educated: Patient Education method: Explanation, Demonstration, Tactile cues, Verbal cues, and Handouts Education comprehension: verbalized understanding, returned demonstration, verbal cues required, and tactile cues required     HOME EXERCISE PROGRAM: Access Code: [email or web address] URL: [email or web address] Date: 09/07/2021 Prepared by: Joellyn RuedAllen [place]   Exercises Seated Scapular Retraction - 6 x daily - 7 x weekly - 1 sets - 5 reps - 3 hold Seated Passive Cervical Retraction - 6 x daily - 7 x weekly - 1 sets - 5 reps - 3 hold Seated Upper Trapezius Stretch - 3 x daily - 7 x weekly - 1 sets - 3 reps - 20 hold Gentle Levator Scapulae Stretch - 3 x daily - 7 x weekly - 1 sets - 3 reps - 20 hold     ASSESSMENT:   CLINICAL IMPRESSION: PT was completed for postural Ed and use of tennis ball for massage for symptom management. Therex was completed to address cervical ROM and tight musculature as well as to address posture with anterior chest stretches and posterior chain strengthening. Pt reports the stretches reduce muscular tension, but all the exs increase the burning sensation at the C7-T1 junction. Burning sensation decreases after the completion of the therexs. Pt tolerated the therex fair with mod increase in burning pain. Therex were added to the pt's HEP.   REHAB POTENTIAL: Good   CLINICAL DECISION MAKING: Stable/uncomplicated   EVALUATION COMPLEXITY: Low     GOALS:     SHORT TERM GOALS= LTGs     LONG TERM GOALS:    LTG Name Target Date  Goal status  1 Pt will be Ind in a final HEP to maintain achieved LOF Baseline: 10/26/21 INITIAL  2 Improved pt's cervical ROM for all motions by 10d  for improved cervical function Baseline: see flow sheets 10/26/21 INITIAL  3 Pt will voice understanding of measures to decrease and manage pain Baseline: 10/26/21 INITIAL  4 Pt will report a decrease in pain with daily activities to a range of 5/10 or less for improved neck function and quality of life Baseline: 10/26/21 INITIAL    PLAN: PT FREQUENCY: 1x/week   PT DURATION: 6 weeks   PLANNED INTERVENTIONS: Therapeutic exercises, Therapeutic activity, Patient/Family education, Joint mobilization, Dry Needling, Electrical stimulation, Cryotherapy, Moist heat, Taping, Traction, Ultrasound, Ionotophoresis 4mg /ml Dexamethasone, and Manual therapy   PLAN FOR  NEXT SESSION: Asses response to HEP, progress therex as indicated, use of manual therapy and modalitiies as indicated      [employer] MS, PT 09/12/21 9:10 AM

## 2021-09-19 ENCOUNTER — Other Ambulatory Visit: Payer: Self-pay

## 2021-09-19 ENCOUNTER — Ambulatory Visit: Admitting: Physical Therapy

## 2021-09-19 ENCOUNTER — Encounter: Payer: Self-pay | Admitting: Physical Therapy

## 2021-09-19 DIAGNOSIS — M546 Pain in thoracic spine: Secondary | ICD-10-CM | POA: Diagnosis not present

## 2021-09-19 DIAGNOSIS — N62 Hypertrophy of breast: Secondary | ICD-10-CM

## 2021-09-19 DIAGNOSIS — R29898 Other symptoms and signs involving the musculoskeletal system: Secondary | ICD-10-CM

## 2021-09-19 NOTE — Therapy (Signed)
OUTPATIENT PHYSICAL THERAPY TREATMENT NOTE   Patient Name: Deborah Gould MRN: [id number] DOB:[birth year], 21 y.o., female Today's Date: 09/19/2021  PCP: Charlane Ferretti, DO REFERRING PROVIDER: Allena Napoleon, MD   PT End of Session - 09/19/21 0802     Visit Number 3    Number of Visits 6    Date for PT Re-Evaluation 11/02/21    Authorization Type CHAMPVA    PT Start Time 0800    PT Stop Time 0845    PT Time Calculation (min) 45 min             Past Medical History:  Diagnosis Date   Allergy    Anxiety    Panic Attacks   Migraine    [place] spotted fever    Past Surgical History:  Procedure Laterality Date   LAPAROSCOPIC APPENDECTOMY N/A 08/01/2016   Procedure: APPENDECTOMY LAPAROSCOPIC;  Surgeon: Leonia Corona, MD;  Location: [facility];  Service: General;  Laterality: N/A;   Nexplanon insertion  05/14/2019   RADIOLOGY WITH ANESTHESIA N/A 03/16/2019   Procedure: MRI OF CERVICAL SPINE AND LUMBAR SPINE WITHOUT CONTRAST;  Surgeon: Radiologist, Medication, MD;  Location: [facility];  Service: Radiology;  Laterality: N/A;   RADIOLOGY WITH ANESTHESIA N/A 07/06/2019   Procedure: MRI WITH ANESTHESIA OF BRAIN WITH AND WIHTOUT CONTRAST;  Surgeon: Radiologist, Medication, MD;  Location: [facility];  Service: Radiology;  Laterality: N/A;   Patient Active Problem List   Diagnosis Date Noted   Numbness 06/15/2019   Tremor 06/15/2019   Vitamin D deficiency 06/15/2019   Neck pain 06/15/2019   Seizure-like activity (HCC) 10/17/2017   Intractable migraine 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 My neck and shoulders are a 7/10 this morning and my legs are sore.  PAIN:  Are you having pain? Yes NPRS scale: 7/10 Pain location: neck and upper shoulder Pain orientation: Bilateral  PAIN TYPE: aching, burning, sharp, and tight Pain description: constant  Aggravating factors: Prolonged sitting, sleeping in certain positions; painful when waking up, airplane turbulance Relieving factors: Heat, ibuprofen  OBJECTIVE:  03/16/19: DIAGNOSTIC FINDINGS:  IMPRESSION: No impingement or other finding to explain left hand symptoms.   Tiny disc protrusions at C5-6 and C6-7 without cord contact.   IMPRESSION: Normal lumbar MRI.     COGNITION: Overall cognitive status: Within functional limits for tasks assessed            SENSATION: Light touch: Appears intact; occasional tingling in L hand for short periods   POSTURE:  Forward head, rounded shoulders, increased thoracic kyphosis   PALPATION: TTP to the suboccipital area, upper traps, levator scapula, and interscapula muscles   CERVICAL AROM/PROM   A/PROM A/PROM (deg) 09/07/2021  Flexion 50  Extension 35  Right lateral flexion 30  Left lateral flexion 30  Right rotation 52  Left rotation 50   (Blank rows = not tested) Pt reports pain and tightness of her neck, upper shouldr and mid back with all cervical movements   UE AROM/PROM:   Bilat Ues are WFLs for  AROM    UE MMT: Bilat UE demonstrate 4+ to 5/5 strength c MMT   CERVICAL SPECIAL TESTS:  Spurling's test: Negative   [facility] Adult PT Treatment:                                                DATE: 09/19/21 Therapeutic Exercise: UBE level 1.0 - unable to tolerate FWD or BWD- increased neck pain. Scapular retraction, x5, 3" Cervical retraction, x5, 3" Upper trap stretch,x2, 20" Levator stretch, x2, 20" Doorway pectoral stretch, x2, 20" Bilat ER 2x10, RTB Shoulder row 1x10,  RTB Shoulder ext 1x10, RTB Open books x 5 each Modalities: Estim - IFC at T2 x 10 minutes         HMP x 10 minutes cervical   Self Care: N/A  [facility]                                                DATE: 09/12/21 Therapeutic Exercise: Scapular retraction, x5, 3" Cervical retraction, x5, 3" Upper trap stretch,x2, 20" Levator stretch, x2, 20" Scalene stretch, x2, 20" Doorway pectoral stretch, x2, 20" Bilat ER 2x10, GTB Shoulder row 2x10, GTB Shoulder ext 2x10, GTB   Self Care: HEP updated. Sitting posture, tennis ball for massage      TREATMENT: Eval - Cervical retractions, x5, 3 sec -Scapular retractions, x5, 3 sec -Upper trap strech, x2, 20 sec -Levator stretch, 2x, 20 sec     PATIENT EDUCATION:  Education details: continue HEP, TENS  Person educated: Patient Education method: Explanation, Demonstration, Tactile cues, Verbal cues, and Handouts Education comprehension: verbalized understanding, returned demonstration, verbal cues required, and tactile cues required     HOME EXERCISE PROGRAM: Access Code: [email or web address] URL: [email or web address] Date: 09/07/2021 Prepared by: Joellyn RuedAllen Ralls   Exercises Seated Scapular Retraction - 6 x daily - 7 x weekly - 1 sets - 5 reps - 3 hold Seated Passive Cervical Retraction - 6 x daily - 7 x weekly - 1 sets - 5 reps - 3 hold Seated Upper Trapezius Stretch - 3 x daily - 7 x weekly - 1 sets - 3 reps - 20 hold Gentle Levator Scapulae Stretch - 3 x daily - 7 x weekly - 1 sets - 3 reps - 20 hold     ASSESSMENT:   CLINICAL IMPRESSION: Pt arrives with increased pain today that she attributes to a long day at work yesterday. PT was completed for postural Ed and therex was completed to address cervical ROM and tight musculature as well as to address posture with anterior chest stretches and posterior chain strengthening. She had increased pain with all therex today. Reduced theraband resistance and reps today  due to pain exacerbation. Trial of Estim at upper thoracic/ lower cervical to reduce pain. Pt reported decreased upper trap and rhomboid pain however no change in pain located at T2.    REHAB POTENTIAL: Good   CLINICAL DECISION MAKING: Stable/uncomplicated   EVALUATION COMPLEXITY: Low     GOALS:     SHORT TERM GOALS= LTGs     LONG TERM GOALS:    LTG Name Target Date Goal status  1 Pt will be Ind in a final HEP to maintain achieved LOF Baseline:  10/26/21 INITIAL  2 Improved pt's cervical ROM for all motions by 10d  for improved cervical function Baseline: see flow sheets 10/26/21 INITIAL  3 Pt will voice understanding of measures to decrease and manage pain Baseline: 10/26/21 INITIAL  4 Pt will report a decrease in pain with daily activities to a range of 5/10 or less for improved neck function and quality of life Baseline: 10/26/21 INITIAL    PLAN: PT FREQUENCY: 1x/week   PT DURATION: 6 weeks   PLANNED INTERVENTIONS: Therapeutic exercises, Therapeutic activity, Patient/Family education, Joint mobilization, Dry Needling, Electrical stimulation, Cryotherapy, Moist heat, Taping, Traction, Ultrasound, Ionotophoresis 4mg /ml Dexamethasone, and Manual therapy   PLAN FOR NEXT SESSION: Asses response to ESTIM / HEP, progress therex as indicated, use of manual therapy and modalitiies as indicated      , PTA 09/19/21 8:32 AM Phone: [phone] Fax: [phone]

## 2021-09-26 ENCOUNTER — Ambulatory Visit

## 2021-09-26 ENCOUNTER — Other Ambulatory Visit: Payer: Self-pay

## 2021-09-26 DIAGNOSIS — M546 Pain in thoracic spine: Secondary | ICD-10-CM | POA: Diagnosis not present

## 2021-09-26 DIAGNOSIS — R29898 Other symptoms and signs involving the musculoskeletal system: Secondary | ICD-10-CM

## 2021-09-26 DIAGNOSIS — N62 Hypertrophy of breast: Secondary | ICD-10-CM

## 2021-09-26 NOTE — Therapy (Signed)
OUTPATIENT PHYSICAL THERAPY TREATMENT NOTE   Patient Name: Tyson AliasMia Paige Trimarco MRN: [id number] DOB:2001-08-16, 21 y.o., female Today's Date: 09/26/2021  PCP: Charlane FerrettiSkakle, [place], DO REFERRING PROVIDER: Charlane FerrettiSkakle, [place], DO   PT End of Session - 09/26/21 [phone]     Visit Number 4    Number of Visits 6    Date for PT Re-Evaluation 11/02/21    Authorization Type CHAMPVA    PT Start Time 0805    PT Stop Time 0845    PT Time Calculation (min) 40 min    Activity Tolerance Patient limited by pain    [facility] tasks assessed/performed              Past Medical History:  Diagnosis Date   Allergy    Anxiety    Panic Attacks   Migraine    [place] spotted fever    Past Surgical History:  Procedure Laterality Date   LAPAROSCOPIC APPENDECTOMY N/A 08/01/2016   Procedure: APPENDECTOMY LAPAROSCOPIC;  Surgeon: Leonia CoronaShuaib Farooqui, MD;  Location: [facility];  Service: General;  Laterality: N/A;   Nexplanon insertion  05/14/2019   RADIOLOGY WITH ANESTHESIA N/A 03/16/2019   Procedure: MRI OF CERVICAL SPINE AND LUMBAR SPINE WITHOUT CONTRAST;  Surgeon: Radiologist, Medication, MD;  Location: [facility];  Service: Radiology;  Laterality: N/A;   RADIOLOGY WITH ANESTHESIA N/A 07/06/2019   Procedure: MRI WITH ANESTHESIA OF BRAIN WITH AND WIHTOUT CONTRAST;  Surgeon: Radiologist, Medication, MD;  Location: [facility];  Service: Radiology;  Laterality: N/A;   Patient Active Problem List   Diagnosis Date Noted   Numbness 06/15/2019   Tremor 06/15/2019   Vitamin D deficiency 06/15/2019   Neck pain 06/15/2019   Seizure-like activity (HCC) 10/17/2017   Intractable migraine without aura and with status migrainosus 05/15/2017   Chronic daily headache 05/15/2017   Anxiety state 05/15/2017   Insomnia 05/15/2017   Acute sinusitis 05/15/2017   Appendicitis 08/01/2016   Acute appendicitis 08/01/2016    REFERRING DIAG: Hypertrophy of breast  THERAPY DIAG:  Pain in thoracic spine  Hypertrophy  of breast  Decreased ROM of neck  PERTINENT HISTORY: Migraines, anxiety  PRECAUTIONS: None  SUBJECTIVE: I did my stretches this AM and my neck and shoulders are feeling a little more loose. My low back is bothering me after work yesterday.  PAIN:  Are you having pain? Yes NPRS scale: 5/10; 7/10 Pain location: neck and upper shoulder; low back Pain orientation: Bilateral  PAIN TYPE: aching, burning, sharp, and tight Pain description: constant  Aggravating factors: Prolonged sitting, sleeping in certain positions; painful when waking up, airplane turbulance Relieving factors: Heat, ibuprofen  OBJECTIVE:  03/16/19: DIAGNOSTIC FINDINGS:  IMPRESSION: No impingement or other finding to explain left hand symptoms.   Tiny disc protrusions at C5-6 and C6-7 without cord contact.   IMPRESSION: Normal lumbar MRI.     COGNITION: Overall cognitive status: Within functional limits for tasks assessed            SENSATION: Light touch: Appears intact; occasional tingling in L hand for short periods   POSTURE:  Forward head, rounded shoulders, increased thoracic kyphosis   PALPATION: TTP to the suboccipital area, upper traps, levator scapula, and interscapula muscles   CERVICAL AROM/PROM   A/PROM A/PROM (deg) 09/07/2021  Flexion 50  Extension 35  Right lateral flexion 30  Left lateral flexion 30  Right rotation 52  Left rotation 50   (Blank rows = not tested) Pt reports pain and tightness of her  neck, upper shouldr and mid back with all cervical movements   UE AROM/PROM:   Bilat Ues are WFLs for AROM    UE MMT: Bilat UE demonstrate 4+ to 5/5 strength c MMT   CERVICAL SPECIAL TESTS:  Spurling's test: Negative   [facility] Adult PT Treatment:                                                DATE: 09/26/21 Therapeutic Exercise: Seated trunk flexion c theraball forward and laterally Trunk ext in sitting c soft foam roller, progressively from low back to mid-thoracic  Supine  lying on soft roller. Felt good for the mid back, but pt deveoped a low back cramp. Pt was then positioned in supine without the foam roller Supine, shoulder hor abd, ER, diagonal pattern 2x10 each Bridging x10 PPT x10 Standing hip ext 2x10   [facility] Adult PT Treatment:                                                DATE: 09/19/21 Therapeutic Exercise: UBE level 1.0 - unable to tolerate FWD or BWD- increased neck pain. Scapular retraction, x5, 3" Cervical retraction, x5, 3" Upper trap stretch,x2, 20" Levator stretch, x2, 20" Doorway pectoral stretch, x2, 20" Bilat ER 2x10, RTB Shoulder row 1x10, RTB Shoulder ext 1x10, RTB Open books x 5 each Modalities: Estim - IFC at T2 x 10 minutes         HMP x 10 minutes cervical   Self Care: N/A  [facility] Adult PT Treatment:                                                DATE: 09/12/21 Therapeutic Exercise: Scapular retraction, x5, 3" Cervical retraction, x5, 3" Upper trap stretch,x2, 20" Levator stretch, x2, 20" Scalene stretch, x2, 20" Doorway pectoral stretch, x2, 20" Bilat ER 2x10, GTB Shoulder row 2x10, GTB Shoulder ext 2x10, GTB   Self Care: HEP updated. Sitting posture, tennis ball for massage      TREATMENT: Eval - Cervical retractions, x5, 3 sec -Scapular retractions, x5, 3 sec -Upper trap strech, x2, 20 sec -Levator stretch, 2x, 20 sec     PATIENT EDUCATION:  Education details: continue HEP, TENS  Person educated: Patient Education method: Explanation, Demonstration, Tactile cues, Verbal cues, and Handouts Education comprehension: verbalized understanding, returned demonstration, verbal cues required, and tactile cues required     HOME EXERCISE PROGRAM: Access Code: [email or web address] URL: [email or web address] Date: 09/07/2021 Prepared by: Joellyn Rued   Exercises Seated Scapular Retraction - 6 x daily - 7 x weekly - 1 sets - 5 reps - 3 hold Seated Passive Cervical Retraction - 6 x daily - 7 x weekly - 1 sets  - 5 reps - 3 hold Seated Upper Trapezius Stretch - 3 x daily - 7 x weekly - 1 sets - 3 reps - 20 hold Gentle Levator Scapulae Stretch - 3 x daily - 7 x weekly - 1 sets - 3 reps - 20 hold     ASSESSMENT:   CLINICAL IMPRESSION: PT was completed to address back posture  and strengthening with the soft foam roller per massage and mobility. Pt had a fair tolerance for use of the soft foam roller in sitting for trunk ext ROM and in supine to address posture. Posterior chain strengthening were completed in supine. Pt tolerated the session without adverse effects. Pt reports a position response with her HEP with decreased pain and tightness after completing this AM.   REHAB POTENTIAL: Good   CLINICAL DECISION MAKING: Stable/uncomplicated   EVALUATION COMPLEXITY: Low     GOALS:     SHORT TERM GOALS= LTGs     LONG TERM GOALS:    LTG Name Target Date Goal status  1 Pt will be Ind in a final HEP to maintain achieved LOF Baseline: 10/26/21 INITIAL  2 Improved pt's cervical ROM for all motions by 10d  for improved cervical function Baseline: see flow sheets 10/26/21 INITIAL  3 Pt will voice understanding of measures to decrease and manage pain Baseline: 10/26/21 INITIAL  4 Pt will report a decrease in pain with daily activities to a range of 5/10 or less for improved neck function and quality of life Baseline: 10/26/21 INITIAL    PLAN: PT FREQUENCY: 1x/week   PT DURATION: 6 weeks   PLANNED INTERVENTIONS: Therapeutic exercises, Therapeutic activity, Patient/Family education, Joint mobilization, Dry Needling, Electrical stimulation, Cryotherapy, Moist heat, Taping, Traction, Ultrasound, Ionotophoresis 4mg /ml Dexamethasone, and Manual therapy   PLAN FOR NEXT SESSION: Asses response to ESTIM / HEP, progress therex as indicated, use of manual therapy and modalitiies as indicated    MS, PT 09/26/21 4:51 PM

## 2021-10-10 ENCOUNTER — Other Ambulatory Visit: Payer: Self-pay

## 2021-10-10 ENCOUNTER — Ambulatory Visit: Attending: Plastic Surgery

## 2021-10-10 DIAGNOSIS — N62 Hypertrophy of breast: Secondary | ICD-10-CM

## 2021-10-10 DIAGNOSIS — M546 Pain in thoracic spine: Secondary | ICD-10-CM

## 2021-10-10 DIAGNOSIS — R29898 Other symptoms and signs involving the musculoskeletal system: Secondary | ICD-10-CM

## 2021-10-10 NOTE — Therapy (Signed)
OUTPATIENT PHYSICAL THERAPY TREATMENT NOTE   Patient Name: Deborah Gould MRN: [id number] DOB:2000-10-18, 21 y.o., female Today's Date: 10/10/2021  PCP: Charlane Ferretti, DO REFERRING PROVIDER: Allena Napoleon, MD   PT End of Session - 10/10/21 0810     Visit Number 5    Number of Visits 6    Date for PT Re-Evaluation 11/02/21    Authorization Type CHAMPVA    PT Start Time 0805    PT Stop Time 0845    PT Time Calculation (min) 40 min    Activity Tolerance Patient limited by pain    Behavior During Therapy [facility] for tasks assessed/performed               Past Medical History:  Diagnosis Date   Allergy    Anxiety    Panic Attacks   Migraine    [place] spotted fever    Past Surgical History:  Procedure Laterality Date   LAPAROSCOPIC APPENDECTOMY N/A 08/01/2016   Procedure: APPENDECTOMY LAPAROSCOPIC;  Surgeon: Leonia Corona, MD;  Location: [facility];  Service: General;  Laterality: N/A;   Nexplanon insertion  05/14/2019   RADIOLOGY WITH ANESTHESIA N/A 03/16/2019   Procedure: MRI OF CERVICAL SPINE AND LUMBAR SPINE WITHOUT CONTRAST;  Surgeon: Radiologist, Medication, MD;  Location: [facility];  Service: Radiology;  Laterality: N/A;   RADIOLOGY WITH ANESTHESIA N/A 07/06/2019   Procedure: MRI WITH ANESTHESIA OF BRAIN WITH AND WIHTOUT CONTRAST;  Surgeon: Radiologist, Medication, MD;  Location: [facility];  Service: Radiology;  Laterality: N/A;   Patient Active Problem List   Diagnosis Date Noted   Numbness 06/15/2019   Tremor 06/15/2019   Vitamin D deficiency 06/15/2019   Neck pain 06/15/2019   Seizure-like activity (HCC) 10/17/2017   Intractable migraine without aura and with status migrainosus 05/15/2017   Chronic daily headache 05/15/2017   Anxiety state 05/15/2017   Insomnia 05/15/2017   Acute sinusitis 05/15/2017   Appendicitis 08/01/2016   Acute appendicitis 08/01/2016    REFERRING DIAG: Hypertrophy of breast  THERAPY DIAG:  Pain in thoracic spine  Hypertrophy  of breast  Decreased ROM of neck  PERTINENT HISTORY: Migraines, anxiety  PRECAUTIONS: None  SUBJECTIVE: My neck and shoulders are doing better. The exs are helping, but my pain level is not where I would like it to be.  PAIN:  Are you having pain? Yes NPRS scale: 5/10-neck Pain location: neck and upper shoulder; low back Pain orientation: Bilateral  PAIN TYPE: aching, burning, sharp, and tight Pain description: constant  Aggravating factors: Prolonged sitting, sleeping in certain positions; painful when waking up, airplane turbulance Relieving factors: Heat, ibuprofen  OBJECTIVE:  03/16/19: DIAGNOSTIC FINDINGS:  IMPRESSION: No impingement or other finding to explain left hand symptoms.   Tiny disc protrusions at C5-6 and C6-7 without cord contact.   IMPRESSION: Normal lumbar MRI.     COGNITION: Overall cognitive status: Within functional limits for tasks assessed            SENSATION: Light touch: Appears intact; occasional tingling in L hand for short periods   POSTURE:  Forward head, rounded shoulders, increased thoracic kyphosis   PALPATION: TTP to the suboccipital area, upper traps, levator scapula, and interscapula muscles   CERVICAL AROM/PROM   A/PROM A/PROM (deg) 09/07/2021  Flexion 50  Extension 35  Right lateral flexion 30  Left lateral flexion 30  Right rotation 52  Left rotation 50   (Blank rows = not tested) Pt reports pain and tightness of her neck,  upper shouldr and mid back with all cervical movements   UE AROM/PROM:   Bilat Ues are WFLs for AROM    UE MMT: Bilat UE demonstrate 4+ to 5/5 strength c MMT   CERVICAL SPECIAL TESTS:  Spurling's test: Negative  [facility] Adult PT Treatment:                                                DATE: 10/10/21 Therapeutic Exercise: UBE level 1.0 - FWD or BWD- 2 min each direction Seated QL stretch, x2, 20"  Seated trunk flexion c theraball forward and laterally Doorway pectoral stretch, x2, 30" Upper  trap stretch c over pressure,x2, 20" Cervical rotation c over pressure, x2, 20" Bilat UE wide backward circles x10 PPT x10 90/90 heel taps c core activation 2x 10  Self Care: Updated and reviewed HEP    [facility] Adult PT Treatment:                                                DATE: 09/26/21 Therapeutic Exercise: Seated trunk flexion c theraball forward and laterally Trunk ext in sitting c soft foam roller, progressively from low back to mid-thoracic  Supine lying on soft roller. Felt good for the mid back, but pt deveoped a low back cramp. Pt was then positioned in supine without the foam roller Supine, shoulder hor abd, ER, diagonal pattern 2x10 each Bridging x10 PPT x10 Standing hip ext 2x10   [facility] Adult PT Treatment:                                                DATE: 09/19/21 Therapeutic Exercise: UBE level 1.0 - unable to tolerate FWD or BWD- increased neck pain. Scapular retraction, x5, 3" Cervical retraction, x5, 3" Upper trap stretch,x2, 20" Levator stretch, x2, 20" Doorway pectoral stretch, x2, 20" Bilat ER 2x10, RTB Shoulder row 1x10, RTB Shoulder ext 1x10, RTB Open books x 5 each Modalities: Estim - IFC at T2 x 10 minutes         HMP x 10 minutes cervical   Self Care: N/A  [facility] Adult PT Treatment:                                                DATE: 09/12/21 Therapeutic Exercise: Scapular retraction, x5, 3" Cervical retraction, x5, 3" Upper trap stretch,x2, 20" Levator stretch, x2, 20" Scalene stretch, x2, 20" Doorway pectoral stretch, x2, 20" Bilat ER 2x10, GTB Shoulder row 2x10, GTB Shoulder ext 2x10, GTB   Self Care: HEP updated. Sitting posture, tennis ball for massage      TREATMENT: Eval - Cervical retractions, x5, 3 sec -Scapular retractions, x5, 3 sec -Upper trap strech, x2, 20 sec -Levator stretch, 2x, 20 sec     PATIENT EDUCATION:  Education details: continue HEP, TENS  Person educated: Patient Education method: Explanation,  Demonstration, Tactile cues, Verbal cues, and Handouts Education comprehension: verbalized understanding, returned demonstration, verbal cues required, and  tactile cues required     HOME EXERCISE PROGRAM: Access Code: [email or web address] URL: [email or web address] Date: 10/10/2021 Prepared by: Joellyn Rued  Exercises Seated Scapular Retraction - 6 x daily - 7 x weekly - 1 sets - 5 reps - 3 hold Seated Passive Cervical Retraction - 6 x daily - 7 x weekly - 1 sets - 5 reps - 3 hold Seated Upper Trapezius Stretch - 3 x daily - 7 x weekly - 1 sets - 3 reps - 20 hold Gentle Levator Scapulae Stretch - 3 x daily - 7 x weekly - 1 sets - 3 reps - 20 hold Seated Scalenes Stretch - 3 x daily - 7 x weekly - 1 sets - 3 reps - 20 hold Doorway Pec Stretch at 90 Degrees Abduction - 3 x daily - 7 x weekly - 1 sets - 3 reps - 20 hold Seated Flexion Stretch with Swiss Ball - 1 x daily - 7 x weekly - 1 sets - 10 reps Seated Quadratus Lumborum Stretch in Chair - 1 x daily - 7 x weekly - 3 sets - 10 reps Shoulder External Rotation and Scapular Retraction with Resistance - 1 x daily - 7 x weekly - 2 sets - 10 reps - 3 hold Standing Shoulder Row with Anchored Resistance - 1 x daily - 7 x weekly - 2 sets - 10 reps - 3 hold Shoulder extension with resistance - Neutral - 1 x daily - 7 x weekly - 2 sets - 10 reps - 3 hold Standing Shoulder Horizontal Abduction with Resistance - 1 x daily - 7 x weekly - 3 sets - 10 reps      ASSESSMENT:   CLINICAL IMPRESSION: PT continues to be provided for cervical and UB stretching and posterior chain strengthening to address posture and pain in this area. Additionally, core strengthening was completed to address weakness of the abdominals and trunk stability. Overall, pt indicates her pain level has decreased and is better managed, but is hoping for further improvement in pain for better QOL. Pt  tolerated today's PT session without adverse effects.   REHAB POTENTIAL: Good    CLINICAL DECISION MAKING: Stable/uncomplicated   EVALUATION COMPLEXITY: Low     GOALS:     SHORT TERM GOALS= LTGs     LONG TERM GOALS:    LTG Name Target Date Goal status  1 Pt will be Ind in a final HEP to maintain achieved LOF Baseline: 10/26/21 INITIAL  2 Improved pt's cervical ROM for all motions by 10d  for improved cervical function Baseline: see flow sheets 10/26/21 INITIAL  3 Pt will voice understanding of measures to decrease and manage pain Baseline: 10/26/21 INITIAL  4 Pt will report a decrease in pain with daily activities to a range of 5/10 or less for improved neck function and quality of life Baseline: 10/26/21 INITIAL    PLAN: PT FREQUENCY: 1x/week   PT DURATION: 6 weeks   PLANNED INTERVENTIONS: Therapeutic exercises, Therapeutic activity, Patient/Family education, Joint mobilization, Dry Needling, Electrical stimulation, Cryotherapy, Moist heat, Taping, Traction, Ultrasound, Ionotophoresis 4mg /ml Dexamethasone, and Manual therapy   PLAN FOR NEXT SESSION: Asses response to ESTIM / HEP, progress therex as indicated, use of manual therapy and modalitiies as indicated. Pt has 1 more PT visit. Will address core strengthening and insure understanding of HEP.    Merica Prell MS, PT 10/10/21 9:54 AM

## 2021-10-16 NOTE — Therapy (Signed)
OUTPATIENT PHYSICAL THERAPY TREATMENT NOTE/DIschagre   Patient Name: Deborah Gould MRN: [id number] DOB:February 21, 2001, 21 y.o., female Today's Date: 10/17/2021  PCP: Sueanne Margarita, DO REFERRING PROVIDER: Cindra Presume, MD   PT End of Session - 10/17/21 [phone]     Visit Number 6    Number of Visits 6    Date for PT Re-Evaluation 11/02/21    Authorization Type CHAM[employer]A    PT Start Time 0809    PT Stop Time 0850    PT Time Calculation (min) 41 min    Activity Tolerance Patient limited by pain    Behavior During Therapy [facility] for tasks assessed/performed                Past Medical History:  Diagnosis Date   Allergy    Anxiety    Panic Attacks   Migraine    [place]y [place] spotted fever    Past Surgical History:  Procedure Laterality Date   LAPAROSCOPIC APPENDECTOMY N/A 08/01/2016   Procedure: APPENDECTOMY LAPAROSCOPIC;  Surgeon: Gerald Stabs, MD;  Location: [place];  Service: General;  Laterality: N/A;   Nexplanon insertion  05/14/2019   RADIOLOGY WITH ANESTHESIA N/A 03/16/2019   Procedure: MRI OF CERVICAL SPINE AND LUMBAR SPINE WITHOUT CONTRAST;  Surgeon: Radiologist, Medication, MD;  Location: [place];  Service: Radiology;  Laterality: N/A;   RADIOLOGY WITH ANESTHESIA N/A 07/06/2019   Procedure: MRI WITH ANESTHESIA OF BRAIN WITH AND WIHTOUT CONTRAST;  Surgeon: Radiologist, Medication, MD;  Location: [place];  Service: Radiology;  Laterality: N/A;   Patient Active Problem List   Diagnosis Date Noted   Numbness 06/15/2019   Tremor 06/15/2019   Vitamin D deficiency 06/15/2019   Neck pain 06/15/2019   Seizure-like activity ([place]) 10/17/2017   Intractable migraine without aura and with status migrainosus 05/15/2017   Chronic daily headache 05/15/2017   Anxiety state 05/15/2017   Insomnia 05/15/2017   Acute sinusitis 05/15/2017   Appendicitis 08/01/2016   Acute appendicitis 08/01/2016    REFERRING DIAG: Hypertrophy of breast  THERAPY DIAG:  Pain in thoracic  spine  Hypertrophy of breast  Decreased ROM of neck  PERTINENT HISTORY: Migraines, anxiety  PRECAUTIONS: None  SUBJECTIVE: My neck and shoulders are continuing to do better. The exs help to manage, but my pain level is not where I would like it tot be.  PAIN:  Are you having pain? Yes NPRS scale: 4/10; neck 0-8/10 pain range Pain location: neck and upper shoulder; low back Pain orientation: Bilateral  PAIN TYPE: aching, burning, sharp, and tight Pain description: constant  Aggravating factors: Prolonged sitting, sleeping in certain positions; painful when waking up, airplane turbulance Relieving factors: Heat, ibuprofen  OBJECTIVE:  03/16/19: DIAGNOSTIC FINDINGS:  IMPRESSION: No impingement or other finding to explain left hand symptoms.   Tiny disc protrusions at C5-6 and C6-7 without cord contact.   IMPRESSION: Normal lumbar MRI.     COGNITION: Overall cognitive status: Within functional limits for tasks assessed            SENSATION: Light touch: Appears intact; occasional tingling in L hand for short periods   POSTURE:  Forward head, rounded shoulders, increased thoracic kyphosis   PALPATION: TTP to the suboccipital area, upper traps, levator scapula, and interscapula muscles   CERVICAL AROM/PROM   A/PROM A/PROM (deg) 09/07/2021 AROM 10/17/21   Flexion 50 55  Extension 35 55  Right lateral flexion 30 43  Left lateral flexion 30 40  Right rotation 52 68  Left rotation  50 66   (Blank rows = not tested) Pt reports pain and tightness of her neck, upper shouldr and mid back with all cervical movements   UE AROM/PROM:   Bilat Ues are WFLs for AROM    UE MMT: Bilat UE demonstrate 4+ to 5/5 strength c MMT   CERVICAL SPECIAL TESTS:  Spurling's test: Negative  [facility] Adult PT Treatment:                                                DATE: 10/17/21 Therapeutic Exercise: Seated QL stretch, x2, 20"  Seated trunk flexion c theraball forward and  laterally Doorway pectoral stretch, x2, 30" Cervical retraction x5, 3sec Upper trap stretch c over pressure,x2, 20" Cervical rotation c over pressure, x2, 20" Bilat UE wide backward circles x10 Shoulder row 2x10, GTB Shoulder ext 2x10, GTB PPT x10 90/90 heel taps c core activation 2x 10 Self Care: Reviewed final HEP. Pt demonstrates proper technique.   [facility] Adult PT Treatment:                                                DATE: 10/10/21 Therapeutic Exercise: UBE level 1.0 - FWD or BWD- 2 min each direction Seated QL stretch, x2, 20"  Seated trunk flexion c theraball forward and laterally Doorway pectoral stretch, x2, 30" Upper trap stretch c over pressure,x2, 20" Cervical rotation c over pressure, x2, 20" Bilat UE wide backward circles x10 PPT x10 90/90 heel taps c core activation 2x 10  Self Care: Updated and reviewed HEP   [facility] Adult PT Treatment:                                                DATE: 09/26/21 Therapeutic Exercise: Seated trunk flexion c theraball forward and laterally Trunk ext in sitting c soft foam roller, progressively from low back to mid-thoracic  Supine lying on soft roller. Felt good for the mid back, but pt deveoped a low back cramp. Pt was then positioned in supine without the foam roller Supine, shoulder hor abd, ER, diagonal pattern 2x10 each Bridging x10 PPT x10 Standing hip ext 2x10     PATIENT EDUCATION:  Education details: continue HEP, TENS  Person educated: Patient Education method: Explanation, Demonstration, Tactile cues, Verbal cues, and Handouts Education comprehension: verbalized understanding, returned demonstration, verbal cues required, and tactile cues required     HOME EXERCISE PROGRAM: Access Code: [email or web address] URL: [email or web address] Date: 10/10/2021 Prepared by: Gar Ponto  Exercises Seated Scapular Retraction - 6 x daily - 7 x weekly - 1 sets - 5 reps - 3 hold Seated Passive Cervical Retraction - 6 x  daily - 7 x weekly - 1 sets - 5 reps - 3 hold Seated Upper Trapezius Stretch - 3 x daily - 7 x weekly - 1 sets - 3 reps - 20 hold Gentle Levator Scapulae Stretch - 3 x daily - 7 x weekly - 1 sets - 3 reps - 20 hold Seated Scalenes Stretch - 3 x daily - 7 x weekly - 1 sets - 3 reps - 20 hold  Doorway Pec Stretch at 90 Degrees Abduction - 3 x daily - 7 x weekly - 1 sets - 3 reps - 20 hold Seated Flexion Stretch with Swiss Ball - 1 x daily - 7 x weekly - 1 sets - 10 reps Seated Quadratus Lumborum Stretch in Chair - 1 x daily - 7 x weekly - 3 sets - 10 reps Shoulder External Rotation and Scapular Retraction with Resistance - 1 x daily - 7 x weekly - 2 sets - 10 reps - 3 hold Standing Shoulder Row with Anchored Resistance - 1 x daily - 7 x weekly - 2 sets - 10 reps - 3 hold Shoulder extension with resistance - Neutral - 1 x daily - 7 x weekly - 2 sets - 10 reps - 3 hold Standing Shoulder Horizontal Abduction with Resistance - 1 x daily - 7 x weekly - 3 sets - 10 reps      ASSESSMENT:   CLINICAL IMPRESSION: PT has benefit from a course of PT. Pt has better understanding of measures to help manage her neck, upper shoulder and mid back pain and now demonstrates much improved cervical ROM with decreased tightness in these areas. Pt is Ind in a HEP to help maintain the progress she has made. Pt is Dced from [place] with goals partially met.   IMPAIRMENTS:  Decreased ROM, decreased strength, postural dysfunction, and pain   GOALS:     SHORT TERM GOALS= LTGs     LONG TERM GOALS:    LTG Name Target Date Goal status  1 Pt will be Ind in a final HEP to maintain achieved LOF Baseline: 10/26/21 MET 10/17/21  2 Improved pt's cervical ROM for all motions by 10d  for improved cervical function. See flow sheet above Baseline: see flow sheets 10/26/21 MET 10/17/21  3 Pt will voice understanding of measures to decrease and manage pain. 10/17/21: pt is using exs, a tennis ball for TPs, and icey hot to manage  pain Baseline: 10/26/21 MET 10/17/21  4 Pt will report a decrease in pain with daily activities to a range of 5/10 or less for improved neck function and quality of life. 10/17/21: 4-8/10 Baseline: 10/26/21 Not MET 10/17/21    PLAN: PT FREQUENCY: 1x/week   PT DURATION: 6 weeks   PLANNED INTERVENTIONS: Therapeutic exercises, Therapeutic activity, Patient/Family education, Joint mobilization, Dry Needling, Electrical stimulation, Cryotherapy, Moist heat, Taping, Traction, Ultrasound, Ionotophoresis 4mg /ml Dexamethasone, and Manual therapy   PLAN FOR NEXT SESSION:    PHYSICAL THERAPY DISCHARGE SUMMARY  Visits from Start of Care: 6  Current functional level related to goals / functional outcomes: See above   Remaining deficits: See above   Education / Equipment: HEP   Patient agrees to discharge. Patient goals were partially met. Patient is being discharged due to  pt being pleased with her progress.   Gwendalyn Mcgonagle MS, PT 10/17/21 5:45 PM

## 2021-10-17 ENCOUNTER — Other Ambulatory Visit: Payer: Self-pay

## 2021-10-17 ENCOUNTER — Ambulatory Visit

## 2021-10-17 DIAGNOSIS — N62 Hypertrophy of breast: Secondary | ICD-10-CM

## 2021-10-17 DIAGNOSIS — M546 Pain in thoracic spine: Secondary | ICD-10-CM | POA: Diagnosis not present

## 2021-10-17 DIAGNOSIS — R29898 Other symptoms and signs involving the musculoskeletal system: Secondary | ICD-10-CM

## 2022-04-10 ENCOUNTER — Ambulatory Visit: Admitting: Nurse Practitioner

## 2022-05-09 ENCOUNTER — Ambulatory Visit: Admitting: Nurse Practitioner

## 2022-05-09 ENCOUNTER — Encounter: Payer: Self-pay | Admitting: Nurse Practitioner

## 2022-05-09 ENCOUNTER — Ambulatory Visit (INDEPENDENT_AMBULATORY_CARE_PROVIDER_SITE_OTHER): Admitting: Nurse Practitioner

## 2022-05-09 VITALS — BP 118/68 | HR 68 | Resp 14 | Ht 63.5 in | Wt 170.0 lb

## 2022-05-09 DIAGNOSIS — N644 Mastodynia: Secondary | ICD-10-CM | POA: Diagnosis not present

## 2022-05-09 DIAGNOSIS — Z3046 Encounter for surveillance of implantable subdermal contraceptive: Secondary | ICD-10-CM

## 2022-05-09 DIAGNOSIS — Z01419 Encounter for gynecological examination (general) (routine) without abnormal findings: Secondary | ICD-10-CM

## 2022-05-09 NOTE — Progress Notes (Signed)
   Alantis Bethune [birth year] [id number]   History:  21 y.o. G0 presents for annual exam. Cycles occur about every 2 months. Nexplanon inserted 05/2019. H/O bilateral breast pain d/t large breasts. She also has occasional clear/white nipple discharge from left breast. Normal prolactin 03/2021. She had negative left breast ultrasound 02/28/2021. Takes Ibuprofen for pain. Saw plastic surgeon to discuss reduction, insurance will not cover at her age. Did PT for a while.   Gynecologic History Patient's last menstrual period was 05/06/2022. Period Cycle (Days): 60 Period Duration (Days): 4-5 Period Pattern: (!) Irregular Menstrual Flow: Moderate, Heavy Menstrual Control: Other (Comment) (flex ring) Dysmenorrhea: None Contraception/Family planning: Nexplanon Sexually active: Yes, declines STD screening  Health Maintenance Last Pap: Not indicated Last mammogram (left ultrasound): 02/28/2021. Results were: Normal Last colonoscopy: Not indicated Last Dexa: Not indicated  Past medical history, past surgical history, family history and social history were all reviewed and documented in the EPIC chart. Long-term boyfriend. Going to school for aviation. 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 She does not know much about biological mother's health.   ROS:  A ROS was performed and pertinent positives and negatives are included.  Exam:  Vitals:   05/09/22 1343  BP: 118/68  Pulse: 68  Resp: 14  Weight: 170 lb (77.1 kg)  Height: 5' 3.5" (1.613 m)    Body mass index is 29.64 kg/m.  General appearance:  Normal Thyroid:  Symmetrical, normal in size, without palpable masses or nodularity. Respiratory  Auscultation:  Clear without wheezing or rhonchi Cardiovascular  Auscultation:  Regular rate, without rubs, murmurs or gallops  Edema/varicosities:  Not grossly evident Abdominal  Soft,nontender, without masses, guarding or  rebound.  Liver/spleen:  No organomegaly noted  Hernia:  None appreciated  Skin  Inspection:  Grossly normal Examined lying and sitting.   Right: Without masses, retractions, nipple discharge or axillary adenopathy.   Left: Without masses, retractions, nipple discharge or axillary  Genitourinary : not indicated  Patient informed chaperone available to be present for breast and pelvic exam. Patient has requested no chaperone to be present. Patient has been advised what will be completed during breast and pelvic exam.   Assessment/Plan:  21 y.o. G0 for annual exam.   Well female exam without gynecological exam - Education provided on SBEs, importance of preventative screenings, current guidelines, high calcium diet, regular exercise, and multivitamin daily.   Encounter for surveillance of implantable subdermal contraceptive - Nexplanon inserted 05/2019. Having cycles about every 2 months.  She is due for exchange and will schedule this now. Backup contraception until then.   Breast pain - H/O bilateral breast pain d/t large breasts. She also has occasional clear/white nipple discharge from left breast. Normal prolactin 03/2021. She had negative left breast ultrasound 02/28/2021. Takes Ibuprofen for pain. Saw plastic surgeon to discuss reduction, insurance will not cover at her age. Did PT for a while. Recommend supportive bras and wearing while sleeping as she is sore when she wakes up. Stopped Voltaren gel since it did not help much.   Return in 1 year for annual.     Olivia Mackie DNP, 2:13 PM 05/09/2022

## 2022-05-10 ENCOUNTER — Encounter: Payer: Self-pay | Admitting: Nurse Practitioner

## 2022-05-13 ENCOUNTER — Other Ambulatory Visit: Payer: Self-pay

## 2022-05-13 DIAGNOSIS — Z3046 Encounter for surveillance of implantable subdermal contraceptive: Secondary | ICD-10-CM

## 2022-05-13 DIAGNOSIS — Z30017 Encounter for initial prescription of implantable subdermal contraceptive: Secondary | ICD-10-CM

## 2022-05-23 ENCOUNTER — Ambulatory Visit: Admitting: Nurse Practitioner

## 2022-05-23 DIAGNOSIS — Z01812 Encounter for preprocedural laboratory examination: Secondary | ICD-10-CM

## 2022-05-24 ENCOUNTER — Ambulatory Visit: Admitting: Radiology

## 2022-05-28 ENCOUNTER — Encounter: Payer: Self-pay | Admitting: Nurse Practitioner

## 2022-05-28 ENCOUNTER — Ambulatory Visit (INDEPENDENT_AMBULATORY_CARE_PROVIDER_SITE_OTHER): Admitting: Nurse Practitioner

## 2022-05-28 VITALS — BP 114/78 | HR 92

## 2022-05-28 DIAGNOSIS — Z3046 Encounter for surveillance of implantable subdermal contraceptive: Secondary | ICD-10-CM | POA: Diagnosis not present

## 2022-05-28 DIAGNOSIS — Z01812 Encounter for preprocedural laboratory examination: Secondary | ICD-10-CM

## 2022-05-28 NOTE — Progress Notes (Signed)
21 y.o. G0P0000 female presents for Nexplanon exchange.  She has been counseled about alternative types of contraception and has decided this long acting method is the best for her.  Procedure, risks and benefits have all been explained.  She has the following questions today:  None.     LMP:  05/06/22   After all questions were answered, consent was obtained.    Past Medical History:  Diagnosis Date   Allergy    Anxiety    Panic Attacks   Migraine    Rocky [place] spotted fever     Past Surgical History:  Procedure Laterality Date   LAPAROSCOPIC APPENDECTOMY N/A 08/01/2016   Procedure: APPENDECTOMY LAPAROSCOPIC;  Surgeon: Gerald Stabs, MD;  Location: [place];  Service: General;  Laterality: N/A;   Nexplanon insertion  05/14/2019   RADIOLOGY WITH ANESTHESIA N/A 03/16/2019   Procedure: MRI OF CERVICAL SPINE AND LUMBAR SPINE WITHOUT CONTRAST;  Surgeon: Radiologist, Medication, MD;  Location: [place];  Service: Radiology;  Laterality: N/A;   RADIOLOGY WITH ANESTHESIA N/A 07/06/2019   Procedure: MRI WITH ANESTHESIA OF BRAIN WITH AND WIHTOUT CONTRAST;  Surgeon: Radiologist, Medication, MD;  Location: [place];  Service: Radiology;  Laterality: N/A;    Current Outpatient Medications on File Prior to Visit  Medication Sig Dispense Refill   Cholecalciferol (DIALYVITE VITAMIN D 5000) 125 MCG (5000 UT) capsule Take 5,000 Units by mouth daily.     Etonogestrel (NEXPLANON [place]) Inject into the skin. Left arm     ferrous sulfate 325 (65 FE) MG tablet Take by mouth.     ibuprofen (ADVIL) 600 MG tablet Take 1 tablet (600 mg total) by mouth every 6 (six) hours as needed. 30 tablet 0   Melatonin 10 MG CAPS Take 10 mg by mouth at bedtime.     No current facility-administered medications on file prior to visit.   Allergies  Allergen Reactions   Other     Mother reports patient is allergic to all pollen, german cockroach, tobacco, mold, and dust.   Diphenhydramine Hcl Nausea Only   Mold Extract  [Trichophyton] Cough    Vitals:   05/28/22 1402  BP: 114/78  Pulse: 92  SpO2: 98%   Physical Exam  Procedure: Patient placed supine on exam table with her left arm flexed at the elbow. The prior insertion site was located and the Nexplanon rod was palpated.  Area cleansed with Betadine x 3 and draped in normal sterile fashion.  Insertion site and surrounding tissue anesthetized with 1% Lidocaine without epinephrine, 1.5 cc total used.  Small incision made with #11 blade.  Nexplanon removed without difficulty.  Steri-strips were applied.  New location site was identified 8-10 cm from epicondyle and 3-5 cm posterior.  Area cleansed with Betadine x 3.  Insertion site and path of insertion was anesthetized with 1% Lidocaine without epinephrine, 1 cc total.  Using Nexplanon device (and after confirming presence of rod in device), skin was pierced and then elevated along insertion path, passing device just under the skin.  Rod released and device inserted.  Rod palpated easily.  Steri-strips applied and a pressure bandage was placed over both sites.  Entire procedure performed with sterile technique.   Assessment: Nexplanon exchange  Plan:  Post procedure instructions reviewed with pt.  Questions answered.  Pt knows to call with any concerns or questions.  Pt is aware removal is due by 3 calendar years from today.

## 2023-02-24 IMAGING — US US ABDOMEN LIMITED RUQ/ASCITES
1 series · 14 of 25 positions shown · non-contrast
Comparison: CT abdomen pelvis September 15, 2015

CLINICAL DATA: Postprandial right upper quadrant pain.

EXAM:
ULTRASOUND ABDOMEN LIMITED RIGHT UPPER QUADRANT

[Series 1: us abdomen limited ruq/ascites · 0.20mm/px · 14 of 43 slices shown]
[im 1/43]
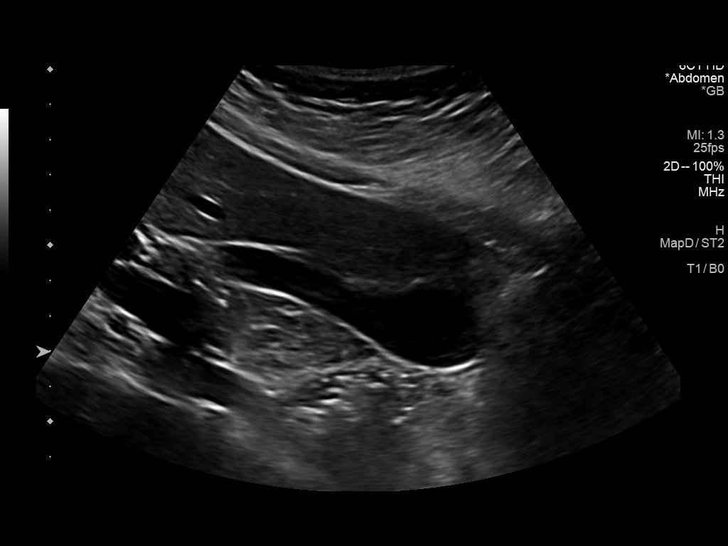
[im 4/43]
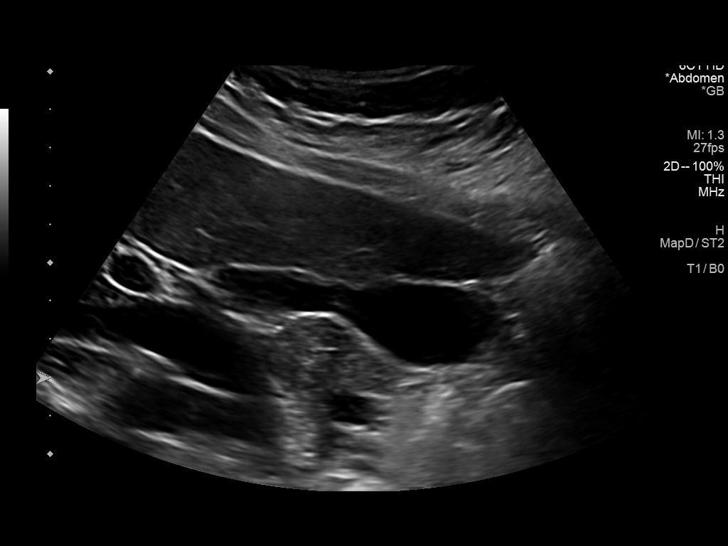
[im 8/43]
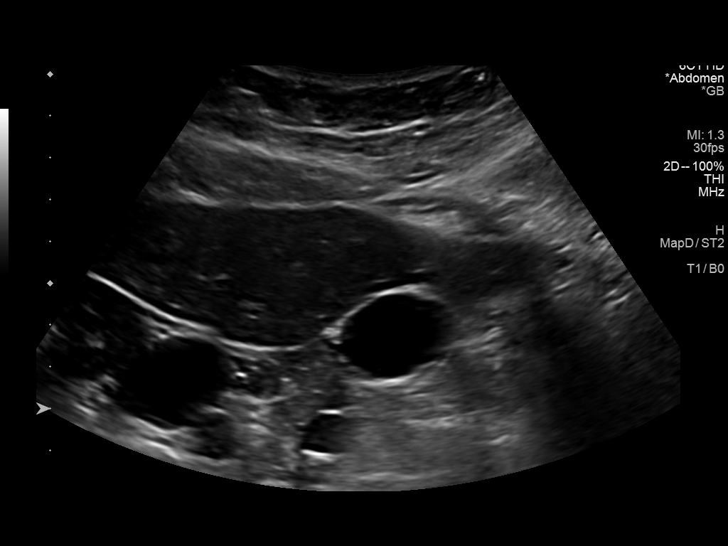
[im 11/43]
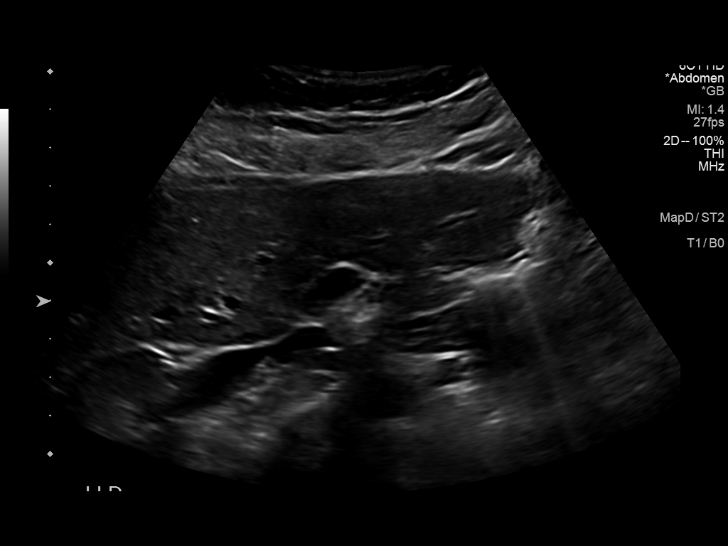
[im 15/43]
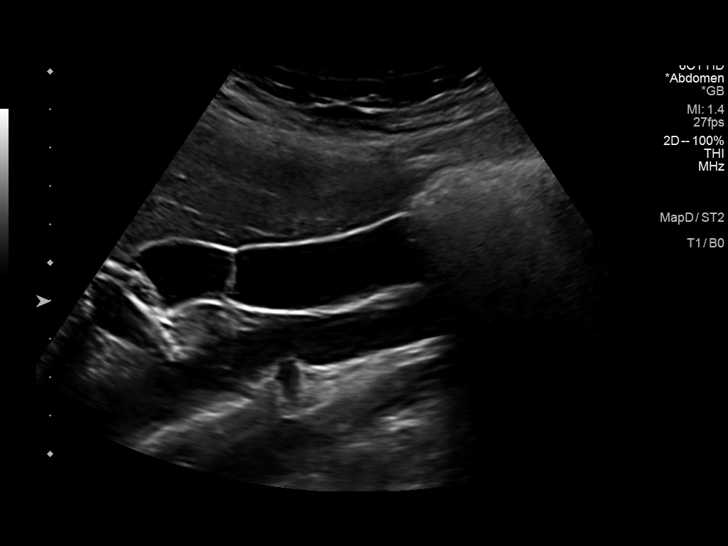
[im 16/43]
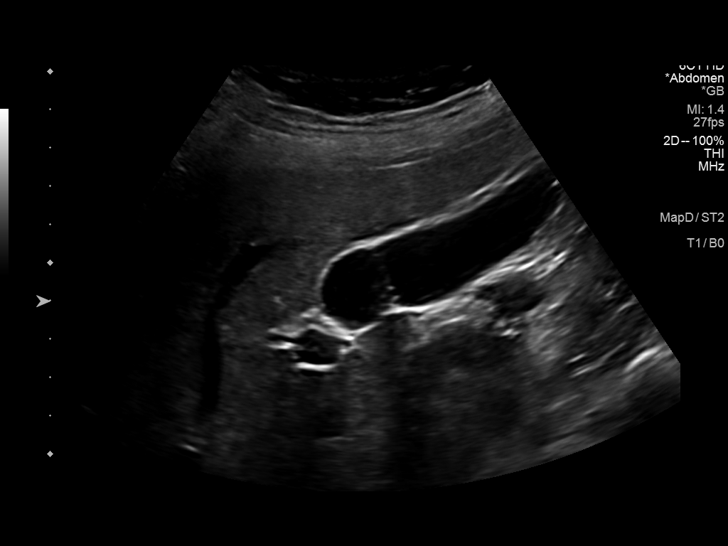
[im 20/43]
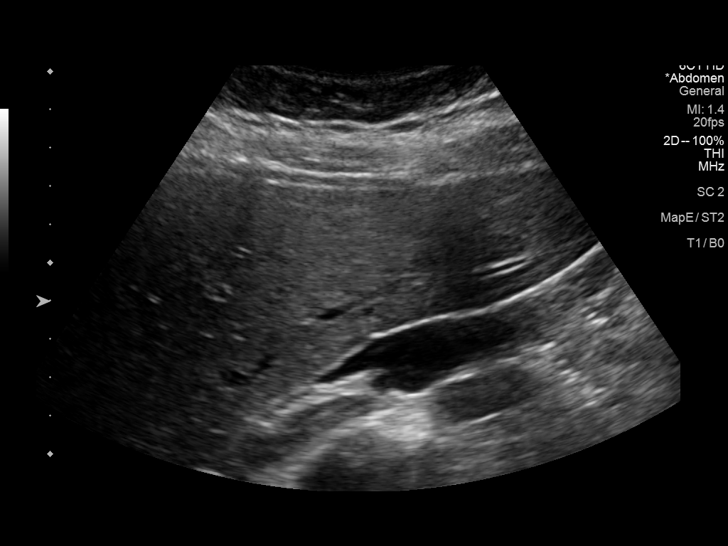
[im 23/43]
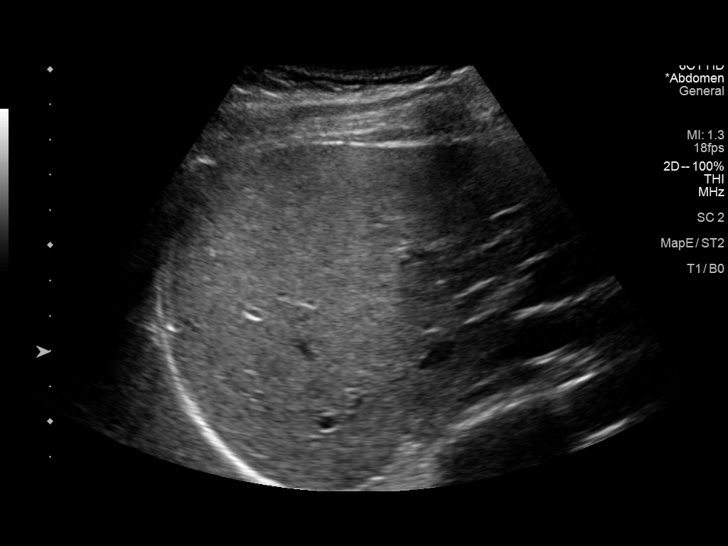
[im 27/43]
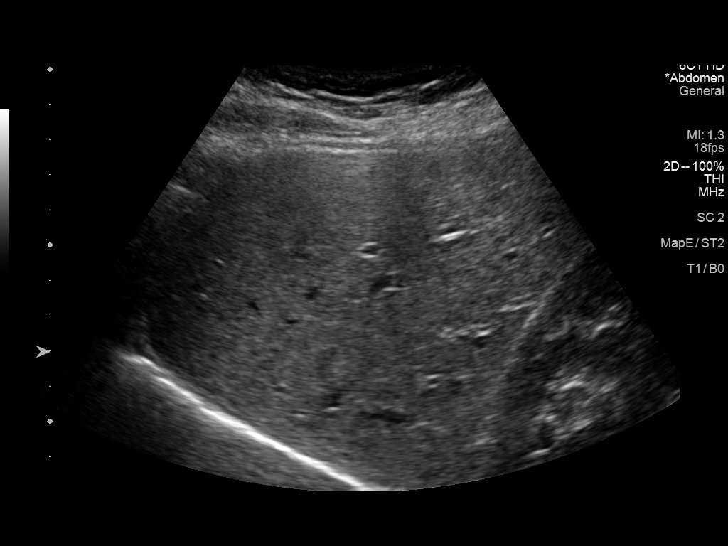
[im 29/43]
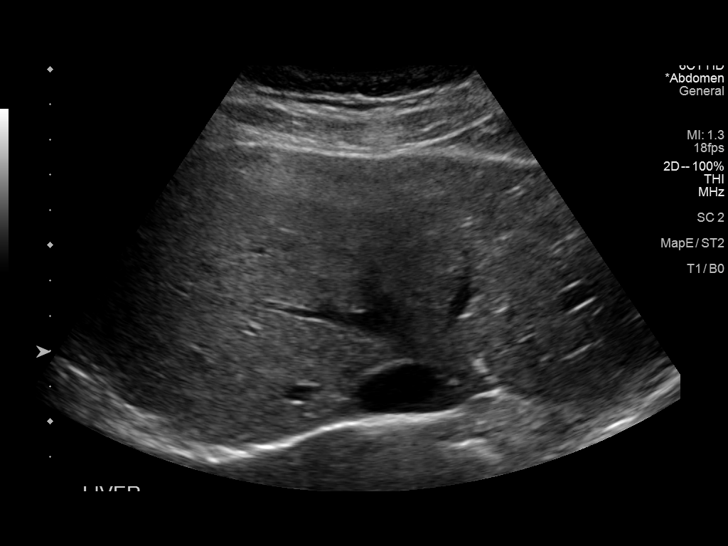
[im 32/43]
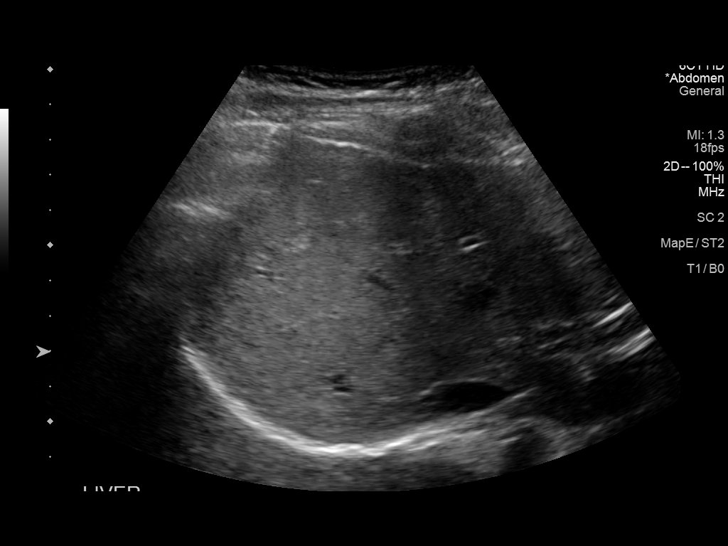
[im 36/43]
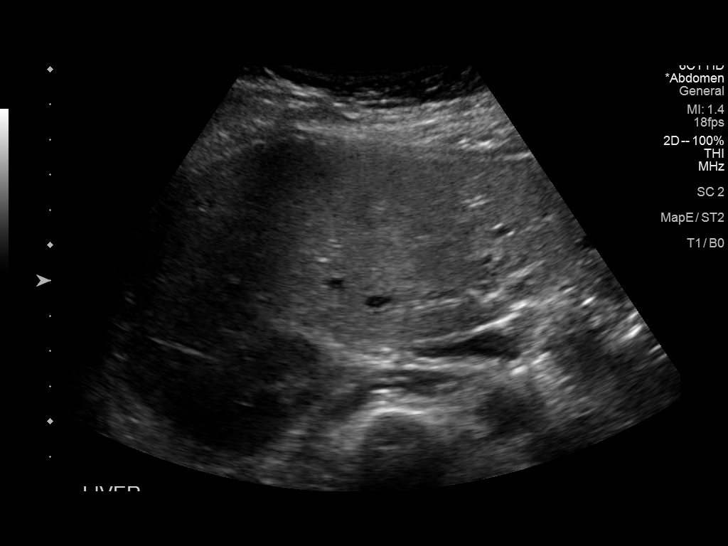
[im 39/43]
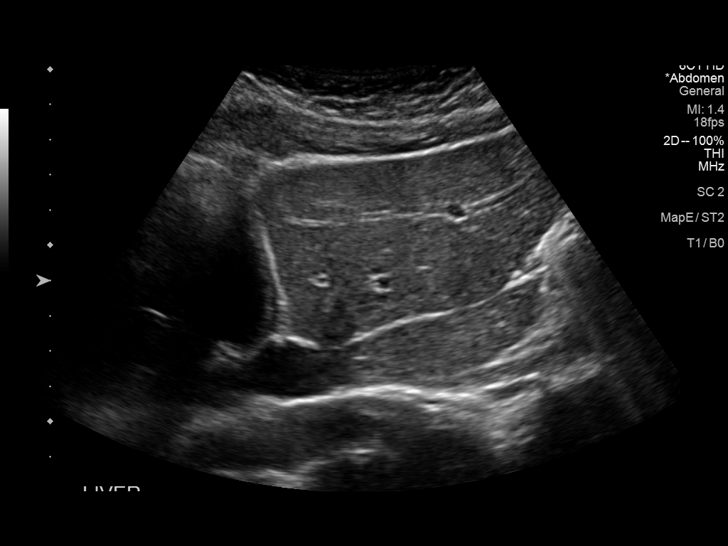
[im 43/43]
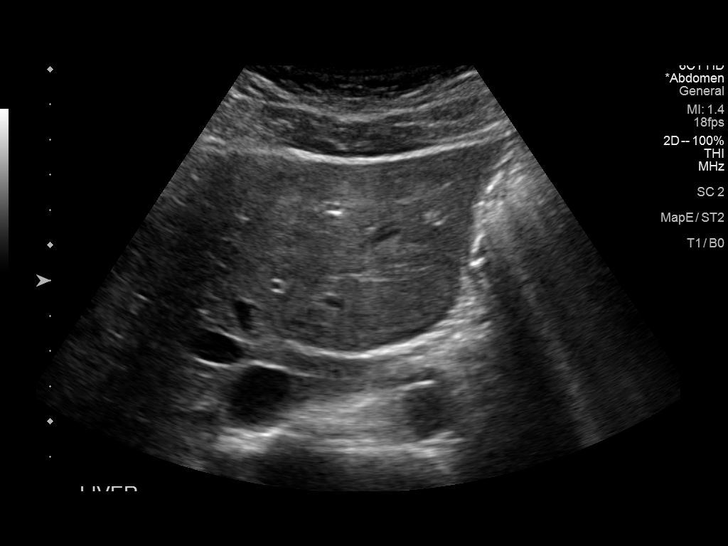

[14 of 25 positions shown; findings below may reference images not displayed]

FINDINGS: Gallbladder:

No gallstones or wall thickening visualized. No sonographic Murphy
sign noted by sonographer.

Common bile duct:

Diameter: 5 mm

Liver:

No focal lesion identified. Within normal limits in parenchymal
echogenicity. Portal vein is patent on color Doppler imaging with
normal direction of blood flow towards the liver.

Other: None.
IMPRESSION: Unremarkable right upper quadrant ultrasound.

## 2023-05-07 ENCOUNTER — Encounter: Payer: Self-pay | Admitting: Nurse Practitioner

## 2023-05-12 ENCOUNTER — Ambulatory Visit: Admitting: Nurse Practitioner

## 2023-05-12 NOTE — Progress Notes (Deleted)
Deborah Gould January 31, 2001 [id number]   History:  22 y.o. G0 presents for annual exam. Nexplanon inserted 05/2022. H/O bilateral breast pain d/t large breasts. She also has occasional clear/white nipple discharge from left breast. Normal prolactin 03/2021. She had negative left breast ultrasound 02/28/2021.   Gynecologic History No LMP recorded. Patient has had an implant.   Contraception/Family planning: Nexplanon Sexually active: Yes  Health Maintenance Last Pap: Never Last mammogram (left ultrasound): 02/28/2021. Results were: Normal Last colonoscopy: Not indicated Last Dexa: Not indicated  Past medical history, past surgical history, family history and social history were all reviewed and documented in the EPIC chart. Long-term boyfriend. Going to school for aviation. 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 She does not know much about biological mother's health.   ROS:  A ROS was performed and pertinent positives and negatives are included.  Exam:  There were no vitals filed for this visit.   There is no height or weight on file to calculate BMI.  General appearance:  Normal Thyroid:  Symmetrical, normal in size, without palpable masses or nodularity. Respiratory  Auscultation:  Clear without wheezing or rhonchi Cardiovascular  Auscultation:  Regular rate, without rubs, murmurs or gallops  Edema/varicosities:  Not grossly evident Abdominal  Soft,nontender, without masses, guarding or rebound.  Liver/spleen:  No organomegaly noted  Hernia:  None appreciated  Skin  Inspection:  Grossly normal Breasts: Examined lying and sitting.   Right: Without masses, retractions, nipple discharge or axillary adenopathy.   Left: Without masses, retractions, nipple discharge or axillary adenopathy.  Pelvic: External genitalia:  no lesions              Urethra:  normal appearing urethra with no masses, tenderness or lesions               Bartholins and Skenes: normal                 Vagina: normal appearing vagina with normal color and discharge, no lesions              Cervix: no lesions, deep in her vagina (need long pederson speculum)  Bimanual Exam:  Uterus:  deep in her pelvis, no masses or tenderness              Adnexa: no mass, fullness, tenderness              Rectovaginal: Deferred              Anus:  normal sphincter tone, no lesions  Patient informed chaperone available to be present for breast and pelvic exam. Patient has requested no chaperone to be present. Patient has been advised what will be completed during breast and pelvic exam.   Assessment/Plan:  22 y.o. G0 for annual exam.   Well female exam without gynecological exam - Education provided on SBEs, importance of preventative screenings, current guidelines, high calcium diet, regular exercise, and multivitamin daily.   Encounter for surveillance of implantable subdermal contraceptive - Nexplanon inserted 05/2022.   Breast pain - H/O bilateral breast pain d/t large breasts. She also has occasional clear/white nipple discharge from left breast. Normal prolactin 03/2021. She had negative left breast ultrasound 02/28/2021. Takes Ibuprofen for pain. Saw plastic surgeon to discuss reduction, insurance will not cover at her age. Did PT for a while. Recommend supportive bras and wearing while sleeping as she is sore when she wakes up. Stopped Voltaren gel since  it did not help much.   Return in 1 year for annual.     Olivia Mackie DNP, 12:01 PM 05/12/2023
